# Patient Record
Sex: Male | Born: 1995 | Race: White | Hispanic: Yes | Marital: Single | Smoking: Never smoker
Health system: Southern US, Community
[De-identification: ages and names within clinical notes are randomized; demographics above are authoritative.]

## PROBLEM LIST (undated history)

## (undated) DIAGNOSIS — J45909 Unspecified asthma, uncomplicated: Secondary | ICD-10-CM

## (undated) HISTORY — PX: TONSILLECTOMY: SUR1361

---
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
## 2014-05-16 ENCOUNTER — Encounter (HOSPITAL_COMMUNITY): Payer: Self-pay | Admitting: Emergency Medicine

## 2014-05-16 ENCOUNTER — Emergency Department (HOSPITAL_COMMUNITY)
Admission: EM | Admit: 2014-05-16 | Discharge: 2014-05-16 | Disposition: A | Discharge status: Home / Self Care | Payer: Self-pay | Attending: Emergency Medicine | Admitting: Emergency Medicine

## 2014-05-16 DIAGNOSIS — R55 Syncope and collapse: Secondary | ICD-10-CM | POA: Insufficient documentation

## 2014-05-16 DIAGNOSIS — E162 Hypoglycemia, unspecified: Secondary | ICD-10-CM | POA: Insufficient documentation

## 2014-05-16 DIAGNOSIS — Z79899 Other long term (current) drug therapy: Secondary | ICD-10-CM | POA: Insufficient documentation

## 2014-05-16 DIAGNOSIS — J45909 Unspecified asthma, uncomplicated: Secondary | ICD-10-CM | POA: Insufficient documentation

## 2014-05-16 HISTORY — DX: Unspecified asthma, uncomplicated: J45.909

## 2014-05-16 LAB — BASIC METABOLIC PANEL
ANION GAP: 4 — AB (ref 5–15)
BUN: 11 mg/dL (ref 6–23)
CO2: 27 mmol/L (ref 19–32)
Calcium: 9.2 mg/dL (ref 8.4–10.5)
Chloride: 108 mmol/L (ref 96–112)
Creatinine, Ser: 0.91 mg/dL (ref 0.50–1.35)
GFR calc Af Amer: >90 mL/min (ref >90)
Glucose, Bld: 92 mg/dL (ref 70–99)
Potassium: 4.3 mmol/L (ref 3.5–5.1)
SODIUM: 139 mmol/L (ref 135–145)

## 2014-05-16 LAB — CBC
HEMATOCRIT: 43.6 % (ref 39.0–52.0)
Hemoglobin: 14.8 g/dL (ref 13.0–17.0)
MCH: 30.3 pg (ref 26.0–34.0)
MCHC: 33.9 g/dL (ref 30.0–36.0)
MCV: 89.3 fL (ref 78.0–100.0)
Platelets: 264 10*3/uL (ref 150–400)
RBC: 4.88 MIL/uL (ref 4.22–5.81)
RDW: 12.9 % (ref 11.5–15.5)
WBC: 7.5 10*3/uL (ref 4.0–10.5)

## 2014-05-16 NOTE — ED Notes (Signed)
After ambulating around ED pt denies dizziness/lightheadedness. BP 96/51 HR 75 sats 97% RA. MD aware.

## 2014-05-16 NOTE — ED Notes (Signed)
Awake. Verbally responsive. A/O x4. Resp even and unlabored. No audible adventitious breath sounds noted. ABC's intact.  

## 2014-05-16 NOTE — ED Notes (Signed)
Bed: [id number] Expected date:  Expected time:  Means of arrival:  Comments: Felt dizzy low blood sugar

## 2014-05-16 NOTE — ED Provider Notes (Signed)
CSN: [id number]     Arrival date & time 05/16/14  1226 History   First MD Initiated Contact with Patient 05/16/14 1232     Chief Complaint  Patient presents with  . Hypoglycemia     (Consider location/radiation/quality/duration/timing/severity/associated sxs/prior Treatment) HPI Comments: The patient is a 19 year old male, history of asthma presents after having a near syncopal event at the gym when he was lifting weights, states that he has never had this before, he states that he was very sweaty after this happened. He had only eaten a small piece of fruit prior to going to the gym this morning, on arrival the paramedics found the patient to be hypoglycemic, gave him oral glucose within normal change in blood sugar to 94 during which time his symptoms resolved as well. He denies any other prodromal symptoms, he has been feeling well lately, he takes no daily medications other than as needed albuterol, denies chest pain shortness breath cough fever or headache chills nausea vomiting blurred vision numbness weakness rashes or swelling.  Patient is a 19 y.o. male presenting with hypoglycemia. The history is provided by the patient.  Hypoglycemia   Past Medical History  Diagnosis Date  . Asthma    Past Surgical History  Procedure Laterality Date  . Tonsillectomy     History reviewed. No pertinent family history. History  Substance Use Topics  . Smoking status: Never Smoker   . Smokeless tobacco: Not on file  . Alcohol Use: No    Review of Systems  All other systems reviewed and are negative.     Allergies  Review of patient's allergies indicates no known allergies.  Home Medications   Prior to Admission medications   Medication Sig Start Date End Date Taking? Authorizing Provider  albuterol (PROVENTIL HFA;VENTOLIN HFA) 108 (90 BASE) MCG/ACT inhaler Inhale 2 puffs into the lungs every 6 (six) hours as needed for wheezing or shortness of breath.   Yes Historical Provider, MD    BP 103/52 mmHg  Pulse 70  Temp(Src) 97.7 F (36.5 C) (Oral)  Resp 14  Ht  (1.702 m)  Wt 120 lb (54.432 kg)  BMI 18.79 kg/m2  SpO2 100% Physical Exam  Constitutional: He appears well-developed and well-nourished. No distress.  HENT:  Head: Normocephalic and atraumatic.  Mouth/Throat: Oropharynx is clear and moist. No oropharyngeal exudate.  Eyes: Conjunctivae and EOM are normal. Pupils are equal, round, and reactive to light. Right eye exhibits no discharge. Left eye exhibits no discharge. No scleral icterus.  Neck: Normal range of motion. Neck supple. No JVD present. No thyromegaly present.  Cardiovascular: Normal rate, regular rhythm, normal heart sounds and intact distal pulses.  Exam reveals no gallop and no friction rub.   No murmur heard. Pulmonary/Chest: Effort normal and breath sounds normal. No respiratory distress. He has no wheezes. He has no rales.  Abdominal: Soft. Bowel sounds are normal. He exhibits no distension and no mass. There is no tenderness.  Musculoskeletal: Normal range of motion. He exhibits no edema or tenderness.  Lymphadenopathy:    He has no cervical adenopathy.  Neurological: He is alert. Coordination normal.  Skin: Skin is warm and dry. No rash noted. No erythema.  Psychiatric: He has a normal mood and affect. His behavior is normal.  Nursing note and vitals reviewed.   ED Course  Procedures (including critical care time) Labs Review Labs Reviewed  BASIC METABOLIC PANEL - Abnormal; Notable for the following:    Anion gap 4 (*)  All other components within normal limits  CBC    Imaging Review No results found.   EKG Interpretation   Date/Time:  Saturday May 16 2014 13:19:25 EDT Ventricular Rate:  72 PR Interval:  124 QRS Duration: 91 QT Interval:  364 QTC Calculation: 398 R Axis:   83 Text Interpretation:  Sinus rhythm Normal ECG No old tracing to compare  Confirmed by Cagney Degrace  MD, Peighton Mehra ([id number]) on 05/16/2014 1:25:49 PM       MDM   Final diagnoses:  Hypoglycemia  Near syncope    Normal cranial nerves III through XII, normal strength and sensation in all fours jammies, normal speech and coordination, normal gait. Vital signs unremarkable, check EKG, hemoglobin, i-STAT chemistry, oral intake, anticipate discharge.  ECG without acute findings - labs normal - pt tolerated PO -s table for d/c.  Eber HongBrian Xai Frerking, MD 05/16/14 [phone]

## 2014-05-16 NOTE — ED Notes (Signed)
Awake. Verbally responsive. A/O x4. Resp even and unlabored. No audible adventitious breath sounds noted. ABC's intact. SR on monitor. IV saline lock patent and intact. 

## 2014-05-16 NOTE — ED Notes (Signed)
Awake. Verbally responsive. A/O x4. Resp even and unlabored. No audible adventitious breath sounds noted. ABC's intact. SR on monitor at 74bpm.

## 2014-05-16 NOTE — ED Notes (Addendum)
Awake. Verbally responsive. A/O x4. Resp even and unlabored. No audible adventitious breath sounds noted. ABC's intact. SR on monitor at 80. IV saline lock patent and intact. Family at bedside. BP 87/47 and asymptomatic. MD aware and given order to ambulated pt and recheck BP.

## 2014-05-16 NOTE — ED Notes (Signed)
Awake. Verbally responsive. A/O x4. Resp even and unlabored. No audible adventitious breath sounds noted. ABC's intact. SR on monitor. IV saline lock patent and intact. Family at bedside. 

## 2014-05-16 NOTE — Discharge Instructions (Signed)
Please call your doctor for a followup appointment within 24-48 hours. When you talk to your doctor please let them know that you were seen in the emergency department and have them acquire all of your records so that they can discuss the findings with you and formulate a treatment plan to fully care for your new and ongoing problems. ° °RESOURCE GUIDE ° °Chronic Pain Problems: °Contact [facility]  [phone] °Patients need to be referred by their primary care doctor. ° °Insufficient Money for Medicine: °Contact [employer]:  call "211."  ° °No Primary Care Doctor: °- Call [facility]  [phone] - can help you locate a primary care doctor that  accepts your insurance, provides certain services, etc. °- Physician Referral Service- [phone] ° °Agencies that provide inexpensive medical care: °- [facility]  [phone] °- [facility]  [phone] °- [facility]  [phone] °- [facility]  [phone] °- [facility]  [phone] °- [facility]  [phone] ° °Medicaid-accepting [place] Providers: °- [facility]- [address] ° [phone], Mon-Fri 9am-7pm, Sat 9am-1pm °- [facility]- [address], [address] ° [phone] °- [facility]- [address], [address] ° [phone] °- [facility]- [address] [place] [address] ° [phone] °- [facility]- [address], [phone] ° Only accepts [place] Access Medicaid patients after they have their name  applied to their card ° °Self Pay (no insurance) in [place]: °- Sickle Cell Patients: Dr Michall Dean, [facility] ° [address], [phone] °- [facility]- [address] ° [phone] °      -     [facility] - [address] °      -     [facility]- see information above (Speak to Pam H if you do not have  insurance) °      -  [facility] [place]- [address],  [phone] °      -  [facility]- [address] [place] [address], [phone] °      -  Dr Osei-Bonsu-  [address], [place], [phone] °      -  [facility] - [address], [phone] °      -  [facility]- [address] [place] [address], [phone], also [address]  [address], [phone] °      -    [facility]- [address], [phone], 1st & 3rd Saturday °       every month, 10am-1pm ° °[facility] °[address] °, [place] [address] °[phone] ° °[facility] °[address] °[place], [place] [address] °([phone] ° °1) Find a Doctor and Pay Out of Pocket °Although you won't have to find out who is covered by your insurance plan, it is a good idea to ask around and get recommendations. You will then need to call the office and see if the doctor you have chosen will accept you as a new patient and what types of options they offer for patients who are self-pay. Some doctors offer discounts or will set up payment plans for their patients who do not have insurance, but you will need to ask so you aren't surprised when you   get to your appointment. ° °2) Contact Your Local Health Department °Not all health departments have doctors that can see patients for sick visits, but many do, so it is worth a call to see if yours does. If you don't know where your local health department is, you can check in your phone book. The CDC also has a tool to help you locate your state's health department, and many state websites also have listings of all of their local health departments. ° °3) Find a Walk-in Clinic °If your illness is not likely to be very severe or complicated, you may want to try a walk in clinic. These are popping up all over the country in pharmacies, drugstores, and shopping centers. They're usually staffed by nurse practitioners or physician  assistants that have been trained to treat common illnesses and complaints. They're usually fairly quick and inexpensive. However, if you have serious medical issues or chronic medical problems, these are probably not your best option ° °STD Testing °- [place] [facility] [place], [facility], [address], [place], phone [phone] or [phone].  Monday - Friday, call for an appointment. °- [place] [facility] [place], [facility], [address], [place], phone [phone] or [phone].  Monday - Friday, call for an appointment. ° °Abuse/Neglect: °- [place] Child Abuse Hotline (336) 641-3795 °- [place] Child Abuse Hotline 800-378-5315 (After Hours) ° °Emergency Shelter:  [facility] (336) 271-5985 ° °Maternity Homes: °- [facility] (336) 275-9566 °- [facility] (704) 372-4663 ° °MRSA Hotline #:   832-7006 ° °Dental Assistance °If unable to pay or uninsured, contact:  [place] [facility] to become qualified for the adult dental clinic. ° °Patients with Medicaid: [facility] °[address], [phone] °[address], [phone] ° °If unable to pay, or uninsured, contact [place] [facility] ([phone] in [place], [phone] in [place]) to become qualified for the adult dental clinic ° °[facility] °[address] °, [place] [address] °(336) 272-4177 °[email or web address] ° °Other Low-Cost Community Dental Services: °- [facility]- [address], [place], [place], [address], [phone], Ext. 123, 2nd and 4th Thursday of the month at 6:30am.  10 clients each day by appointment, can sometimes see walk-in patients if someone does not show for an appointment. °- [facility]- [address], [place], [place], [address], [phone] °- [facility]- [address], [place], [place],  [address], [phone] °- [facility]- [phone] °- [facility]- [phone] °- [facility]- [phone] °-  °

## 2014-05-16 NOTE — ED Notes (Signed)
Pt arrived via EMS with c/o dizziness, near syncope episode and diaphoresis after working out [employer]. Pt had eaten only a banana prior to working out and first day working out in a year. EMS checked CBG at 66 and given glucose tube x1 with increase to 94 and pt reported symptoms resolved.

## 2019-01-03 ENCOUNTER — Other Ambulatory Visit: Payer: Self-pay

## 2019-01-03 ENCOUNTER — Emergency Department (HOSPITAL_COMMUNITY)
Admission: EM | Admit: 2019-01-03 | Discharge: 2019-01-03 | Disposition: A | Discharge status: Home / Self Care | Payer: Self-pay | Attending: Emergency Medicine | Admitting: Emergency Medicine

## 2019-01-03 ENCOUNTER — Encounter (HOSPITAL_COMMUNITY): Payer: Self-pay | Admitting: Emergency Medicine

## 2019-01-03 DIAGNOSIS — Y9389 Activity, other specified: Secondary | ICD-10-CM | POA: Insufficient documentation

## 2019-01-03 DIAGNOSIS — S51812A Laceration without foreign body of left forearm, initial encounter: Secondary | ICD-10-CM | POA: Insufficient documentation

## 2019-01-03 DIAGNOSIS — Y999 Unspecified external cause status: Secondary | ICD-10-CM | POA: Insufficient documentation

## 2019-01-03 DIAGNOSIS — W25XXXA Contact with sharp glass, initial encounter: Secondary | ICD-10-CM | POA: Insufficient documentation

## 2019-01-03 DIAGNOSIS — S41112A Laceration without foreign body of left upper arm, initial encounter: Secondary | ICD-10-CM

## 2019-01-03 DIAGNOSIS — Z23 Encounter for immunization: Secondary | ICD-10-CM | POA: Insufficient documentation

## 2019-01-03 DIAGNOSIS — Y929 Unspecified place or not applicable: Secondary | ICD-10-CM | POA: Insufficient documentation

## 2019-01-03 DIAGNOSIS — J45909 Unspecified asthma, uncomplicated: Secondary | ICD-10-CM | POA: Insufficient documentation

## 2019-01-03 HISTORY — DX: Unspecified asthma, uncomplicated: J45.909

## 2019-01-03 MED ORDER — LIDOCAINE HCL (PF) 1 % IJ SOLN
30.0000 mL | Freq: Once | INTRAMUSCULAR | Status: AC
  Administered 2019-01-03: 30 mL
  Filled 2019-01-03: qty 30

## 2019-01-03 MED ORDER — TETANUS-DIPHTH-ACELL PERTUSSIS 5-2.5-18.5 LF-MCG/0.5 IM SUSP
0.5000 mL | Freq: Once | INTRAMUSCULAR | Status: AC
  Administered 2019-01-03: 0.5 mL via INTRAMUSCULAR
  Filled 2019-01-03: qty 0.5

## 2019-01-03 MED ORDER — ACETAMINOPHEN 325 MG PO TABS
650.0000 mg | ORAL_TABLET | Freq: Once | ORAL | Status: AC
  Administered 2019-01-03: 650 mg via ORAL
  Filled 2019-01-03: qty 2

## 2019-01-03 NOTE — Discharge Instructions (Addendum)
1. Medications: Tylenol or ibuprofen for pain, usual home medications ° °2. Treatment: ice for swelling, keep wound clean with warm soap and water and keep bandage dry, do not submerge in water for 24 hours ° °3. Follow Up: Please return in 10 days to have your stitches/staples removed or sooner if you have concerns. Return to the emergency department for increased redness, drainage of pus from the wound ° ° °WOUND CARE ° Keep area clean and dry for 24 hours. Do not remove bandage, if applied. ° After 24 hours, remove bandage and wash wound gently with mild soap and warm water. Reapply a new bandage after cleaning wound, if directed.  ° Continue daily cleansing with soap and water until stitches/staples are removed. ° Do not apply any ointments or creams to the wound while stitches/staples are in place, as this may cause delayed healing. °Return if you experience any of the following signs of infection: Swelling, redness, pus drainage, streaking, fever >101.0 F ° Return if you experience excessive bleeding that does not stop after 15-20 minutes of constant, firm pressure. ° °

## 2019-01-03 NOTE — ED Triage Notes (Signed)
Pt reports cutting his Left forearm on a glass mug while at work today, last tetanus unknown. Bleeding controlled with bandage.

## 2019-01-03 NOTE — ED Provider Notes (Addendum)
[facility] EMERGENCY DEPARTMENT Provider Note   CSN: [id number] Arrival date & time: 01/03/19  1432     History Chief Complaint  Patient presents with  . Laceration    Aaron Franklin is a 23 y.o. male the past medical history of asthma presents emergency room today with chief complaint of laceration to left forearm.  Onset was acute happening 2 hours prior to arrival.  Patient states he was at work when he was carrying a large glass mug that broke.  He states the mug broke into two large pieces. The glass did not shatter and there were no small pieces.  He immediately applied pressure to the laceration and bleeding was able to be controlled. He is reporting pain localized to the laceration.  He describes pain as sharp.  He rates pain 10 of 10 in severity.  He did not take any medications for pain prior to arrival.  He is unsure of last tetanus immunization.  He did not fall, hit his head or lose consciousness.  Denies any numbness, weakness, tingling sensation.  History provided by patient with additional history obtained from chart review.      Past Medical History:  Diagnosis Date  . Asthma     There are no problems to display for this patient.   Past Surgical History:  Procedure Laterality Date  . TONSILLECTOMY         No family history on file.  Social History   Tobacco Use  . Smoking status: Not on file  Substance Use Topics  . Alcohol use: Not on file  . Drug use: Not on file    Home Medications Prior to Admission medications   Not on File    Allergies    Patient has no known allergies.  Review of Systems   Review of Systems All other systems are reviewed and are negative for acute change except as noted in the HPI.  Physical Exam Updated Vital Signs BP 100/66   Pulse 83   Temp 97.9 F (36.6 C) (Oral)   Resp 12   SpO2 98%   Physical Exam Vitals and nursing note reviewed.  Constitutional:      Appearance: He is  well-developed. He is not ill-appearing or toxic-appearing.  HENT:     Head: Normocephalic and atraumatic.     Nose: Nose normal.  Eyes:     General: No scleral icterus.       Right eye: No discharge.        Left eye: No discharge.     Conjunctiva/sclera: Conjunctivae normal.  Neck:     Vascular: No JVD.  Cardiovascular:     Rate and Rhythm: Normal rate and regular rhythm.     Pulses: Normal pulses.          Radial pulses are 2+ on the right side and 2+ on the left side.     Heart sounds: Normal heart sounds.  Pulmonary:     Effort: Pulmonary effort is normal.     Breath sounds: Normal breath sounds.  Abdominal:     General: There is no distension.  Musculoskeletal:        General: Normal range of motion.     Left elbow: Normal.     Left hand: Normal.     Cervical back: Normal range of motion.  Skin:    General: Skin is warm and dry.     Findings: Bruising: .Charlotta Newton.     Comments: 3 cm laceration  to volar aspect of left forearm. Bleeding is controlled. Strength of left wrist intact against resistance.  Brisk cap refill. Radial pulse is 2+ bilaterally. Able to wiggle all fingers   Neurological:     Mental Status: He is oriented to person, place, and time.     GCS: GCS eye subscore is 4. GCS verbal subscore is 5. GCS motor subscore is 6.     Comments: Fluent speech, no facial droop.  Psychiatric:        Behavior: Behavior normal.       ED Results / Procedures / Treatments   Labs (all labs ordered are listed, but only abnormal results are displayed) Labs Reviewed - No data to display  EKG None  Radiology No results found.  Procedures .Marland KitchenLaceration Repair  Date/Time: 01/03/2019 4:32 PM Performed by: Cherre Robins, PA-C Authorized by: Cherre Robins, PA-C   Consent:    Consent obtained:  Verbal   Consent given by:  Patient   Risks discussed:  Infection, pain and retained foreign body   Alternatives discussed:  No treatment Universal protocol:     Immediately prior to procedure, a time out was called: yes     Patient identity confirmed:  Verbally with patient and arm band Anesthesia (see MAR for exact dosages):    Anesthesia method:  Local infiltration   Local anesthetic:  Lidocaine 1% w/o epi Laceration details:    Location:  Shoulder/arm   Shoulder/arm location:  L lower arm   Length (cm):  3   Depth (mm):  2 Repair type:    Repair type:  Simple Pre-procedure details:    Preparation:  Patient was prepped and draped in usual sterile fashion Exploration:    Hemostasis achieved with:  Direct pressure   Wound exploration: wound explored through full range of motion and entire depth of wound probed and visualized     Wound extent: no foreign bodies/material noted, no muscle damage noted, no nerve damage noted, no tendon damage noted and no vascular damage noted     Contaminated: no   Treatment:    Area cleansed with:  Saline   Amount of cleaning:  Standard   Irrigation solution:  Sterile water   Irrigation volume:  500 ml   Irrigation method:  Syringe   Visualized foreign bodies/material removed: no   Skin repair:    Repair method:  Sutures   Suture size:  4-0   Suture material:  Prolene   Suture technique:  Simple interrupted   Number of sutures:  4 Approximation:    Approximation:  Close Post-procedure details:    Dressing:  Bulky dressing   Patient tolerance of procedure:  Tolerated well, no immediate complications   (including critical care time)  Medications Ordered in ED Medications  lidocaine (PF) (XYLOCAINE) 1 % injection 30 mL (30 mLs Infiltration Given 01/03/19 1537)  Tdap (BOOSTRIX) injection 0.5 mL (0.5 mLs Intramuscular Given 01/03/19 1535)  acetaminophen (TYLENOL) tablet 650 mg (650 mg Oral Given 01/03/19 1606)    ED Course  I have reviewed the triage vital signs and the nursing notes.  Pertinent labs & imaging results that were available during my care of the patient were reviewed by me and  considered in my medical decision making (see chart for details).    MDM Rules/Calculators/A&P                      Patient presents to the emergency department with laceration to left forearm  which occurred within 2 hours PTA . Patient nontoxic appearing, resting comfortably. Pressure irrigation performed.  Imaging not indicated at this time.  No signs of tendon or muscle injury on exam.  Radial pulse 2+ bilaterally.  Wound explored and base of wound visualized in a bloodless field without evidence of foreign body. Laceration repair per procedure note above, tolerated well. Tetanus updated at today's visit. Do not feel that abx are indicated at this time based on wound appearance and lack of significant comorbidities. Discussed suture home care as well as need for wound recheck and suture removal in 10 days.  I discussed results, treatment plan, need for follow-up, and return precautions with the patient including signs of infection. Provided opportunity for questions, patient confirmed understanding and is in agreement with plan.     Portions of this note were generated with Scientist, clinical (histocompatibility and immunogenetics)Dragon dictation software. Dictation errors may occur despite best attempts at proofreading.   Final Clinical Impression(s) / ED Diagnoses Final diagnoses:  Laceration of left upper extremity, initial encounter    Rx / DC Orders ED Discharge Orders    None       Sherene Sireslbrizze, Finley Chevez E, PA-C 01/03/19 1627    Sherene Sireslbrizze, Sharlyne Koeneman E, PA-C 01/03/19 1633    Charlynne PanderYao, David Hsienta, MD 01/03/19 2047

## 2019-01-03 NOTE — ED Notes (Signed)
Pt discharge instructions reviewed with the patient. The patient verbalized understanding of instructions. Pt discharged. 

## 2022-08-25 ENCOUNTER — Emergency Department (HOSPITAL_COMMUNITY): Payer: Self-pay

## 2022-08-25 ENCOUNTER — Encounter (HOSPITAL_COMMUNITY): Payer: Self-pay | Admitting: *Deleted

## 2022-08-25 ENCOUNTER — Inpatient Hospital Stay (HOSPITAL_COMMUNITY)
Admission: EM | Admit: 2022-08-25 | Discharge: 2022-08-28 | DRG: 141 | Disposition: A | Discharge status: Home / Self Care | Payer: Self-pay | Attending: Surgery | Admitting: Surgery

## 2022-08-25 ENCOUNTER — Other Ambulatory Visit: Payer: Self-pay

## 2022-08-25 DIAGNOSIS — S32040A Wedge compression fracture of fourth lumbar vertebra, initial encounter for closed fracture: Secondary | ICD-10-CM | POA: Diagnosis present

## 2022-08-25 DIAGNOSIS — S0285XA Fracture of orbit, unspecified, initial encounter for closed fracture: Secondary | ICD-10-CM | POA: Diagnosis present

## 2022-08-25 DIAGNOSIS — Y92410 Unspecified street and highway as the place of occurrence of the external cause: Secondary | ICD-10-CM

## 2022-08-25 DIAGNOSIS — S0219XA Other fracture of base of skull, initial encounter for closed fracture: Secondary | ICD-10-CM | POA: Diagnosis present

## 2022-08-25 DIAGNOSIS — S02412A LeFort II fracture, initial encounter for closed fracture: Secondary | ICD-10-CM | POA: Diagnosis present

## 2022-08-25 DIAGNOSIS — M26220 Open anterior occlusal relationship: Secondary | ICD-10-CM | POA: Diagnosis present

## 2022-08-25 DIAGNOSIS — J45909 Unspecified asthma, uncomplicated: Secondary | ICD-10-CM | POA: Diagnosis present

## 2022-08-25 DIAGNOSIS — M264 Malocclusion, unspecified: Secondary | ICD-10-CM | POA: Diagnosis present

## 2022-08-25 DIAGNOSIS — S022XXA Fracture of nasal bones, initial encounter for closed fracture: Secondary | ICD-10-CM | POA: Diagnosis present

## 2022-08-25 DIAGNOSIS — S01511A Laceration without foreign body of lip, initial encounter: Secondary | ICD-10-CM | POA: Diagnosis present

## 2022-08-25 LAB — COMPREHENSIVE METABOLIC PANEL
ALT: 43 U/L (ref 0–44)
AST: 40 U/L (ref 15–41)
Albumin: 4 g/dL (ref 3.5–5.0)
Alkaline Phosphatase: 57 U/L (ref 38–126)
Anion gap: 9 (ref 5–15)
BUN: 13 mg/dL (ref 6–20)
CO2: 23 mmol/L (ref 22–32)
Calcium: 8.8 mg/dL — ABNORMAL LOW (ref 8.9–10.3)
Chloride: 104 mmol/L (ref 98–111)
Creatinine, Ser: 0.9 mg/dL (ref 0.61–1.24)
GFR, Estimated: >60 mL/min (ref >60)
Glucose, Bld: 119 mg/dL — ABNORMAL HIGH (ref 70–99)
Potassium: 3.5 mmol/L (ref 3.5–5.1)
Sodium: 136 mmol/L (ref 135–145)
Total Bilirubin: 0.9 mg/dL (ref 0.3–1.2)
Total Protein: 5.2 g/dL — ABNORMAL LOW (ref 6.5–8.1)

## 2022-08-25 LAB — SAMPLE TO BLOOD BANK

## 2022-08-25 LAB — I-STAT CHEM 8, ED
BUN: 16 mg/dL (ref 6–20)
Calcium, Ion: 1.11 mmol/L — ABNORMAL LOW (ref 1.15–1.40)
Chloride: 104 mmol/L (ref 98–111)
Creatinine, Ser: 0.9 mg/dL (ref 0.61–1.24)
Glucose, Bld: 114 mg/dL — ABNORMAL HIGH (ref 70–99)
HCT: 49 % (ref 39.0–52.0)
Hemoglobin: 16.7 g/dL (ref 13.0–17.0)
Potassium: 3.6 mmol/L (ref 3.5–5.1)
Sodium: 139 mmol/L (ref 135–145)
TCO2: 23 mmol/L (ref 22–32)

## 2022-08-25 LAB — CBC
HCT: 48.6 % (ref 39.0–52.0)
Hemoglobin: 16.4 g/dL (ref 13.0–17.0)
MCH: 30 pg (ref 26.0–34.0)
MCHC: 33.7 g/dL (ref 30.0–36.0)
MCV: 89 fL (ref 80.0–100.0)
Platelets: 291 10*3/uL (ref 150–400)
RBC: 5.46 MIL/uL (ref 4.22–5.81)
RDW: 13.2 % (ref 11.5–15.5)
WBC: 10.9 10*3/uL — ABNORMAL HIGH (ref 4.0–10.5)
nRBC: 0 % (ref 0.0–0.2)

## 2022-08-25 LAB — URINALYSIS, ROUTINE W REFLEX MICROSCOPIC
Bilirubin Urine: NEGATIVE
Glucose, UA: NEGATIVE mg/dL
Ketones, ur: 80 mg/dL — AB
Leukocytes,Ua: NEGATIVE
Nitrite: NEGATIVE
Protein, ur: NEGATIVE mg/dL
Specific Gravity, Urine: >1.046 — ABNORMAL HIGH (ref 1.005–1.030)
pH: 7 (ref 5.0–8.0)

## 2022-08-25 LAB — HIV ANTIBODY (ROUTINE TESTING W REFLEX): HIV Screen 4th Generation wRfx: NONREACTIVE

## 2022-08-25 LAB — I-STAT CG4 LACTIC ACID, ED: Lactic Acid, Venous: 1.9 mmol/L (ref 0.5–1.9)

## 2022-08-25 LAB — PROTIME-INR
INR: 1.1 (ref 0.8–1.2)
Prothrombin Time: 14.8 s (ref 11.4–15.2)

## 2022-08-25 LAB — ETHANOL: Alcohol, Ethyl (B): <10 mg/dL (ref <10)

## 2022-08-25 MED ORDER — ONDANSETRON HCL 4 MG/2ML IJ SOLN
4.0000 mg | Freq: Once | INTRAMUSCULAR | Status: AC
  Administered 2022-08-25: 4 mg via INTRAVENOUS
  Filled 2022-08-25: qty 2

## 2022-08-25 MED ORDER — MELATONIN 3 MG PO TABS: 3.0000 mg | ORAL_TABLET | Freq: Every evening | ORAL | Status: DC | PRN

## 2022-08-25 MED ORDER — HYDRALAZINE HCL 20 MG/ML IJ SOLN: 10.0000 mg | INTRAMUSCULAR | Status: DC | PRN

## 2022-08-25 MED ORDER — IOHEXOL 350 MG/ML SOLN
75.0000 mL | Freq: Once | INTRAVENOUS | Status: AC | PRN
  Administered 2022-08-25: 75 mL via INTRAVENOUS

## 2022-08-25 MED ORDER — LIDOCAINE-EPINEPHRINE (PF) 2 %-1:200000 IJ SOLN
10.0000 mL | Freq: Once | INTRAMUSCULAR | Status: AC
  Administered 2022-08-25: 10 mL
  Filled 2022-08-25: qty 20

## 2022-08-25 MED ORDER — METOPROLOL TARTRATE 5 MG/5ML IV SOLN: 5.0000 mg | Freq: Four times a day (QID) | INTRAVENOUS | Status: DC | PRN

## 2022-08-25 MED ORDER — MORPHINE SULFATE (PF) 4 MG/ML IV SOLN
4.0000 mg | Freq: Once | INTRAVENOUS | Status: AC
  Administered 2022-08-25: 4 mg via INTRAVENOUS
  Filled 2022-08-25: qty 1

## 2022-08-25 MED ORDER — OXYCODONE-ACETAMINOPHEN 5-325 MG PO TABS
2.0000 | ORAL_TABLET | Freq: Once | ORAL | Status: AC
  Administered 2022-08-25: 2 via ORAL
  Filled 2022-08-25: qty 2

## 2022-08-25 MED ORDER — OXYCODONE HCL 5 MG PO TABS
5.0000 mg | ORAL_TABLET | ORAL | Status: DC | PRN
  Administered 2022-08-25: 5 mg via ORAL
  Filled 2022-08-25 (×2): qty 1

## 2022-08-25 MED ORDER — OXYMETAZOLINE HCL 0.05 % NA SOLN
3.0000 | Freq: Once | NASAL | Status: AC
  Administered 2022-08-25: 3 via NASAL
  Filled 2022-08-25: qty 30

## 2022-08-25 MED ORDER — ACETAMINOPHEN 500 MG PO TABS
1000.0000 mg | ORAL_TABLET | Freq: Four times a day (QID) | ORAL | Status: DC
  Administered 2022-08-25 – 2022-08-28 (×10): 1000 mg via ORAL
  Filled 2022-08-25 (×11): qty 2

## 2022-08-25 MED ORDER — ENOXAPARIN SODIUM 30 MG/0.3ML IJ SOSY
30.0000 mg | PREFILLED_SYRINGE | Freq: Two times a day (BID) | INTRAMUSCULAR | Status: DC
  Administered 2022-08-26 – 2022-08-28 (×4): 30 mg via SUBCUTANEOUS
  Filled 2022-08-25 (×4): qty 0.3

## 2022-08-25 MED ORDER — SODIUM CHLORIDE 0.9 % IV BOLUS
1000.0000 mL | Freq: Once | INTRAVENOUS | Status: AC
  Administered 2022-08-25: 1000 mL via INTRAVENOUS

## 2022-08-25 MED ORDER — DOCUSATE SODIUM 100 MG PO CAPS
100.0000 mg | ORAL_CAPSULE | Freq: Two times a day (BID) | ORAL | Status: DC
  Administered 2022-08-25 – 2022-08-28 (×5): 100 mg via ORAL
  Filled 2022-08-25 (×5): qty 1

## 2022-08-25 MED ORDER — METHOCARBAMOL 500 MG PO TABS
500.0000 mg | ORAL_TABLET | Freq: Three times a day (TID) | ORAL | Status: DC
  Administered 2022-08-25 – 2022-08-28 (×8): 500 mg via ORAL
  Filled 2022-08-25 (×8): qty 1

## 2022-08-25 MED ORDER — SODIUM CHLORIDE 0.9 % IV SOLN: INTRAVENOUS | Status: DC

## 2022-08-25 MED ORDER — METHOCARBAMOL 1000 MG/10ML IJ SOLN
500.0000 mg | Freq: Three times a day (TID) | INTRAVENOUS | Status: DC
  Filled 2022-08-25: qty 5

## 2022-08-25 MED ORDER — POLYETHYLENE GLYCOL 3350 17 G PO PACK: 17.0000 g | PACK | Freq: Every day | ORAL | Status: DC | PRN

## 2022-08-25 MED ORDER — MORPHINE SULFATE (PF) 2 MG/ML IV SOLN
2.0000 mg | INTRAVENOUS | Status: DC | PRN
  Administered 2022-08-27: 2 mg via INTRAVENOUS
  Filled 2022-08-25: qty 1

## 2022-08-25 MED ORDER — ONDANSETRON HCL 4 MG/2ML IJ SOLN
4.0000 mg | Freq: Four times a day (QID) | INTRAMUSCULAR | Status: DC | PRN
  Administered 2022-08-27: 4 mg via INTRAVENOUS
  Filled 2022-08-25: qty 2

## 2022-08-25 MED ORDER — ONDANSETRON 4 MG PO TBDP: 4.0000 mg | ORAL_TABLET | Freq: Four times a day (QID) | ORAL | Status: DC | PRN

## 2022-08-25 NOTE — Anesthesia Preprocedure Evaluation (Signed)
Anesthesia Evaluation  Patient identified by MRN, date of birth, ID band Patient awake    Reviewed: Allergy & Precautions, NPO status , Patient's Chart, lab work & pertinent test results  History of Anesthesia Complications Negative for: history of anesthetic complications  Airway Mallampati: III  TM Distance: >3 FB Neck ROM: Full  Mouth opening: Limited Mouth Opening  Dental  (+) Dental Advisory Given   Pulmonary asthma    Pulmonary exam normal        Cardiovascular negative cardio ROS Normal cardiovascular exam     Neuro/Psych negative neurological ROS  negative psych ROS   GI/Hepatic negative GI ROS, Neg liver ROS,,,  Endo/Other  negative endocrine ROS    Renal/GU negative Renal ROS     Musculoskeletal negative musculoskeletal ROS (+)    Abdominal   Peds  Hematology negative hematology ROS (+)   Anesthesia Other Findings   Reproductive/Obstetrics                             Anesthesia Physical Anesthesia Plan  ASA: 2  Anesthesia Plan: General   Post-op Pain Management: Toradol IV (intra-op)* and Ofirmev IV (intra-op)*   Induction: Intravenous  PONV Risk Score and Plan: 2 and Treatment may vary due to age or medical condition, Ondansetron, Dexamethasone and Midazolam  Airway Management Planned: Video Laryngoscope Planned and Nasal ETT  Additional Equipment: None  Intra-op Plan:   Post-operative Plan: Extubation in OR  Informed Consent: I have reviewed the patients History and Physical, chart, labs and discussed the procedure including the risks, benefits and alternatives for the proposed anesthesia with the patient or authorized representative who has indicated his/her understanding and acceptance.     Dental advisory given  Plan Discussed with: CRNA, Anesthesiologist and Surgeon  Anesthesia Plan Comments: (Surgeon states he requires nasal intubation for this  procedure, feels that securing airway via right nare is feasible. Surgeon to be at bedside during airway manipulation )       Anesthesia Quick Evaluation
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
## 2022-08-25 NOTE — ED Notes (Signed)
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 bone findings. Other: None. CT MAXILLOFACIAL FINDINGS Osseous: There are complex bilateral facial fractures. -there are mildly displaced fractures of the medial and lateral pterygoid plates on the left in the lateral pterygoid plate on the right. -there is a comminuted fracture of the orbital floor on the left (series 9, image 61). There is a nondisplaced fracture of the lateral orbital wall on the left (series 3, image 68). There is also a nondisplaced fracture through the frontal process of the left maxillary bone (series 9, image 70). -there is a mildly displaced fracture of the anterior, lateral, and posterior wall of the left maxillary sinus. Fracture along the anterior maxillary wall extends through the left nasolacrimal duct (series 3, image 58). Fracture of the posterior left maxillary sinus extends into the left sphenopalatine foramen (series 3, image 59). -there is a mildly displaced fracture of the extends through the posterior aspect of the alveolar ridge on the left (series 9, image 56). -mildly displaced nasal bone fractures bilaterally. There is also a mildly displaced fracture of the  bony nasal septum (series 9, image 65). -there is a subtle cortical irregularity along the jugular foramen (series 3, image 58), which raises the possibility for an occult left temporal bone fracture. There is a trace left mastoid effusion. Orbits: There are small amount of blood products of the orbital floor on the left (series 7, image 68). Orbits are otherwise unremarkable. Sinuses: There is near-complete opacification of the bilateral maxillary, ethmoid sinuses. Soft tissues: 1 there is soft swelling along the bilateral pre maxillary soft tissues, left-greater-than-right, as well as the soft tissues along the nasal bridge. There is subcutaneous emphysema. CT CERVICAL SPINE FINDINGS  Limitations: Motion degraded exam Alignment: Straightening of the normal cervical lordosis. Skull base and vertebrae: No acute fracture. No primary bone lesion or focal pathologic process. Soft tissues and spinal canal: No prevertebral fluid or swelling. No visible canal hematoma. Disc levels:  No CT evidence of high-grade spinal canal stenosis. Upper chest: Negative. Other: None IMPRESSION: 1. Multiple complex facial bone fractures, as above. Of note, there is a Jerry Caras type 2 fracture on the left. 2. There may also be an occulte temporal bone fracture that extends into the left jugular foramen. Recommend further evaluation with a dedicated temporal bone CT. 3. Subtle cortical irregularity along the left jugular foramen, which raises the possibility for an occult left temporal bone fracture. Recommend correlation with point tenderness. 4. No CT evidence of intracranial injury. 5. No acute fracture or traumatic subluxation of the cervical spine. Electronically Signed   By: Lorenza Cambridge M.D.   On: 08/25/2022 11:31   DG Pelvis Portable  Result Date: 08/25/2022 CLINICAL DATA:  Pain after trauma EXAM: PORTABLE PELVIS 1 VIEWS COMPARISON:  None Available. FINDINGS: There is no evidence of pelvic fracture or diastasis. No pelvic bone  lesions are seen. IMPRESSION: No acute osseous abnormality Electronically Signed   By: Karen Kays M.D.   On: 08/25/2022 10:48   DG Chest Port 1 View  Result Date: 08/25/2022 CLINICAL DATA:  Pain after trauma EXAM: PORTABLE CHEST 1 VIEW COMPARISON:  None Available. FINDINGS: The heart size and mediastinal contours are within normal limits. Both lungs are clear. No consolidation, pneumothorax or effusion. No edema. The inferior costophrenic angles are clipped off the edge of the film. The visualized skeletal structures are unremarkable. Overlapping cardiac leads. IMPRESSION: No acute cardiopulmonary disease. Electronically Signed   By: Karen Kays M.D.   On: 08/25/2022 10:47    Pending Labs Unresulted Labs (From admission, onward)     Start     Ordered   08/26/22 0500  CBC  Tomorrow morning,   R        08/25/22 1557   08/26/22 0500  Basic metabolic panel  Tomorrow morning,   R        08/25/22 1557   08/25/22 1556  HIV Antibody (routine testing w rflx)  (HIV Antibody (Routine testing w reflex) panel)  Once,   R        08/25/22 1557            Vitals/Pain Today's Vitals   08/25/22 1515 08/25/22 1530 08/25/22 1545 08/25/22 1548  BP: 128/76 120/78 126/73   Pulse: (!) 104 (!) 105 (!) 108   Resp: 16 12 18    Temp:      TempSrc:      SpO2: 97% 97% 97%   Weight:      Height:      PainSc:    5     Isolation Precautions No active isolations  Medications Medications  acetaminophen (TYLENOL) tablet 1,000 mg (has no administration in time range)  methocarbamol (ROBAXIN) tablet 500 mg (500 mg Oral Given 08/25/22 1641)    Or  methocarbamol (ROBAXIN) 500 mg in dextrose 5 % 50 mL IVPB ( Intravenous See Alternative 08/25/22 1641)  docusate sodium (COLACE) capsule 100 mg (has no administration in time range)  polyethylene glycol (MIRALAX / GLYCOLAX) packet 17 g (has no administration in time range)  ondansetron (ZOFRAN-ODT) disintegrating tablet 4 mg (has no administration in time range)     Or  ondansetron (ZOFRAN) injection 4 mg (has no administration  in time range)  metoprolol tartrate (LOPRESSOR) injection 5 mg (has no administration in time range)  hydrALAZINE (APRESOLINE) injection 10 mg (has no administration in time range)  enoxaparin (LOVENOX) injection 30 mg (has no administration in time range)  oxyCODONE (Oxy IR/ROXICODONE) immediate release tablet 5-10 mg (has no administration in time range)  morphine (PF) 2 MG/ML injection 2 mg (has no administration in time range)  melatonin tablet 3 mg (has no administration in time range)  0.9 %  sodium chloride infusion ( Intravenous New Bag/Given 08/25/22 1645)  morphine (PF) 4 MG/ML injection 4 mg (4 mg Intravenous Given 08/25/22 0959)  lidocaine-EPINEPHrine (XYLOCAINE W/EPI) 2 %-1:200000 (PF) injection 10 mL (10 mLs Infiltration Given 08/25/22 1112)  iohexol (OMNIPAQUE) 350 MG/ML injection 75 mL (75 mLs Intravenous Contrast Given 08/25/22 1050)  oxymetazoline (AFRIN) 0.05 % nasal spray 3 spray (3 sprays Each Nare Given 08/25/22 1306)  oxyCODONE-acetaminophen (PERCOCET/ROXICET) 5-325 MG per tablet 2 tablet (2 tablets Oral Given 08/25/22 1408)  ondansetron (ZOFRAN) injection 4 mg (4 mg Intravenous Given 08/25/22 1506)  sodium chloride 0.9 % bolus 1,000 mL (0 mLs Intravenous Stopped 08/25/22 1544)    Mobility Walks needs back brace applied before walking      Focused Assessments    R Recommendations: See Admitting Provider Note  Report given to:   Additional Notes:
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
## 2022-08-25 NOTE — Progress Notes (Signed)
Orthopedic Tech Progress Note Patient Details:  Aaron Franklin [birth year] [id number]  Ortho Devices Type of Ortho Device: Lumbar corsett Ortho Device/Splint Interventions: Ordered, Adjustment   Post Interventions Patient Tolerated: Well Instructions Provided: Care of device, Adjustment of device Patient received hanger LSO due to initially being told he was being discharged. Darleen Crocker 08/25/2022, 3:22 PM
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
## 2022-08-25 NOTE — ED Notes (Signed)
Per Dr. Criss Alvine--- OK to bypass labs for CT.   Anell Barr, RN  Trauma Response Nurse [phone]

## 2022-08-25 NOTE — Progress Notes (Signed)
   08/25/22 1004  Spiritual Encounters  Type of Visit Initial  Care provided to: Patient  Conversation partners present during encounter Nurse  Referral source Trauma page  Reason for visit Trauma  OnCall Visit No   Chaplain responding to Trauma 2 page.  Pt unavailable as medical team provided care. No support persons present yet, though Pt states his father is coming.  Chaplain services remain available by Spiritual Consult or for emergent cases, paging [phone]  Chaplain Raelene Bott, MDiv [email or web address] [email or web address] [phone]
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
## 2022-08-26 ENCOUNTER — Other Ambulatory Visit: Payer: Self-pay

## 2022-08-26 ENCOUNTER — Inpatient Hospital Stay (HOSPITAL_COMMUNITY): Payer: Self-pay | Admitting: Anesthesiology

## 2022-08-26 ENCOUNTER — Encounter (HOSPITAL_COMMUNITY): Payer: Self-pay

## 2022-08-26 ENCOUNTER — Encounter (HOSPITAL_COMMUNITY): Admission: EM | Disposition: A | Discharge status: Home / Self Care | Payer: Self-pay | Source: Home / Self Care

## 2022-08-26 DIAGNOSIS — S02412A LeFort II fracture, initial encounter for closed fracture: Secondary | ICD-10-CM

## 2022-08-26 DIAGNOSIS — M264 Malocclusion, unspecified: Secondary | ICD-10-CM

## 2022-08-26 HISTORY — PX: ORIF MANDIBULAR FRACTURE: SHX2127

## 2022-08-26 LAB — GLUCOSE, CAPILLARY
Glucose-Capillary: 118 mg/dL — ABNORMAL HIGH (ref 70–99)
Glucose-Capillary: 157 mg/dL — ABNORMAL HIGH (ref 70–99)
Glucose-Capillary: 130 mg/dL — ABNORMAL HIGH (ref 70–99)

## 2022-08-26 SURGERY — OPEN REDUCTION INTERNAL FIXATION (ORIF) MANDIBULAR FRACTURE
Anesthesia: General | Site: Mouth | Laterality: Bilateral

## 2022-08-26 MED ORDER — PROPOFOL 10 MG/ML IV BOLUS
INTRAVENOUS | Status: DC | PRN
Start: 2022-08-26 — End: 2022-08-26
  Administered 2022-08-26: 200 mg via INTRAVENOUS
  Administered 2022-08-26: 50 ug/kg/min via INTRAVENOUS

## 2022-08-26 MED ORDER — OXYMETAZOLINE HCL 0.05 % NA SOLN
NASAL | Status: AC
Start: 2022-08-26 — End: 2022-08-26
  Filled 2022-08-26: qty 30

## 2022-08-26 MED ORDER — 0.9 % SODIUM CHLORIDE (POUR BTL) OPTIME
TOPICAL | Status: DC | PRN
  Administered 2022-08-26: 1000 mL

## 2022-08-26 MED ORDER — MIDAZOLAM HCL 2 MG/2ML IJ SOLN
INTRAMUSCULAR | Status: DC | PRN
  Administered 2022-08-26: 2 mg via INTRAVENOUS

## 2022-08-26 MED ORDER — OXYCODONE HCL 5 MG PO TABS: 5.0000 mg | ORAL_TABLET | Freq: Once | ORAL | Status: DC | PRN

## 2022-08-26 MED ORDER — AMISULPRIDE (ANTIEMETIC) 5 MG/2ML IV SOLN: 10.0000 mg | Freq: Once | INTRAVENOUS | Status: DC | PRN

## 2022-08-26 MED ORDER — BACITRACIN ZINC 500 UNIT/GM EX OINT
TOPICAL_OINTMENT | CUTANEOUS | Status: DC | PRN
  Administered 2022-08-26: 1 via TOPICAL

## 2022-08-26 MED ORDER — CEFAZOLIN SODIUM-DEXTROSE 2-4 GM/100ML-% IV SOLN
INTRAVENOUS | Status: AC
  Filled 2022-08-26: qty 100

## 2022-08-26 MED ORDER — LACTATED RINGERS IV SOLN: INTRAVENOUS | Status: DC | PRN

## 2022-08-26 MED ORDER — FENTANYL CITRATE (PF) 250 MCG/5ML IJ SOLN
INTRAMUSCULAR | Status: DC | PRN
  Administered 2022-08-26 (×4): 50 ug via INTRAVENOUS

## 2022-08-26 MED ORDER — CEFAZOLIN SODIUM-DEXTROSE 2-3 GM-%(50ML) IV SOLR
INTRAVENOUS | Status: DC | PRN
  Administered 2022-08-26: 2 g via INTRAVENOUS

## 2022-08-26 MED ORDER — METHYLPREDNISOLONE 4 MG PO TBPK: 4.0000 mg | ORAL_TABLET | Freq: Four times a day (QID) | ORAL | Status: DC

## 2022-08-26 MED ORDER — FENTANYL CITRATE (PF) 250 MCG/5ML IJ SOLN
INTRAMUSCULAR | Status: AC
  Filled 2022-08-26: qty 5

## 2022-08-26 MED ORDER — METHYLPREDNISOLONE 4 MG PO TBPK: 4.0000 mg | ORAL_TABLET | Freq: Three times a day (TID) | ORAL | Status: DC

## 2022-08-26 MED ORDER — METHYLPREDNISOLONE 4 MG PO TBPK
8.0000 mg | ORAL_TABLET | Freq: Every morning | ORAL | Status: AC
  Administered 2022-08-26: 8 mg via ORAL
  Filled 2022-08-26: qty 21

## 2022-08-26 MED ORDER — BACITRACIN ZINC 500 UNIT/GM EX OINT
TOPICAL_OINTMENT | CUTANEOUS | Status: AC
  Filled 2022-08-26: qty 28.35

## 2022-08-26 MED ORDER — METHYLPREDNISOLONE 4 MG PO TBPK
8.0000 mg | ORAL_TABLET | Freq: Every evening | ORAL | Status: AC
  Administered 2022-08-26: 8 mg via ORAL

## 2022-08-26 MED ORDER — DEXAMETHASONE SODIUM PHOSPHATE 10 MG/ML IJ SOLN
INTRAMUSCULAR | Status: DC | PRN
  Administered 2022-08-26: 10 mg via INTRAVENOUS

## 2022-08-26 MED ORDER — SUGAMMADEX SODIUM 200 MG/2ML IV SOLN
INTRAVENOUS | Status: DC | PRN
  Administered 2022-08-26: 200 mg via INTRAVENOUS

## 2022-08-26 MED ORDER — PHENYLEPHRINE HCL-NACL 20-0.9 MG/250ML-% IV SOLN
INTRAVENOUS | Status: DC | PRN
  Administered 2022-08-26: 10 ug/min via INTRAVENOUS
  Administered 2022-08-26 (×2): 80 ug via INTRAVENOUS

## 2022-08-26 MED ORDER — LIDOCAINE-EPINEPHRINE 1 %-1:100000 IJ SOLN
INTRAMUSCULAR | Status: AC
  Filled 2022-08-26: qty 1

## 2022-08-26 MED ORDER — METHYLPREDNISOLONE 4 MG PO TBPK: 8.0000 mg | ORAL_TABLET | Freq: Every evening | ORAL | Status: DC

## 2022-08-26 MED ORDER — LIDOCAINE-EPINEPHRINE 1 %-1:100000 IJ SOLN
INTRAMUSCULAR | Status: DC | PRN
  Administered 2022-08-26: 7 mL

## 2022-08-26 MED ORDER — OXYCODONE HCL 5 MG/5ML PO SOLN: 5.0000 mg | Freq: Once | ORAL | Status: DC | PRN

## 2022-08-26 MED ORDER — CHLORHEXIDINE GLUCONATE 0.12 % MT SOLN
15.0000 mL | Freq: Once | OROMUCOSAL | Status: AC
  Administered 2022-08-26: 15 mL via OROMUCOSAL

## 2022-08-26 MED ORDER — ORAL CARE MOUTH RINSE: 15.0000 mL | Freq: Once | OROMUCOSAL | Status: AC

## 2022-08-26 MED ORDER — METHYLPREDNISOLONE 4 MG PO TBPK
4.0000 mg | ORAL_TABLET | ORAL | Status: AC
  Administered 2022-08-26: 4 mg via ORAL

## 2022-08-26 MED ORDER — LIDOCAINE 2% (20 MG/ML) 5 ML SYRINGE
INTRAMUSCULAR | Status: DC | PRN
  Administered 2022-08-26: 60 mg via INTRAVENOUS

## 2022-08-26 MED ORDER — DEXMEDETOMIDINE HCL IN NACL 80 MCG/20ML IV SOLN
INTRAVENOUS | Status: DC | PRN
  Administered 2022-08-26 (×2): 10 ug via INTRAVENOUS

## 2022-08-26 MED ORDER — EPINEPHRINE PF 1 MG/ML IJ SOLN
INTRAMUSCULAR | Status: AC
  Filled 2022-08-26: qty 1

## 2022-08-26 MED ORDER — EPINEPHRINE HCL (NASAL) 0.1 % NA SOLN
NASAL | Status: DC | PRN
  Administered 2022-08-26: 4 [drp] via TOPICAL

## 2022-08-26 MED ORDER — ROCURONIUM BROMIDE 10 MG/ML (PF) SYRINGE
PREFILLED_SYRINGE | INTRAVENOUS | Status: DC | PRN
  Administered 2022-08-26: 70 mg via INTRAVENOUS
  Administered 2022-08-26: 20 mg via INTRAVENOUS
  Administered 2022-08-26 (×2): 30 mg via INTRAVENOUS

## 2022-08-26 MED ORDER — CHLORHEXIDINE GLUCONATE 0.12 % MT SOLN
15.0000 mL | Freq: Two times a day (BID) | OROMUCOSAL | Status: DC
  Administered 2022-08-26 – 2022-08-28 (×4): 15 mL via OROMUCOSAL
  Filled 2022-08-26 (×4): qty 15

## 2022-08-26 MED ORDER — CHLORHEXIDINE GLUCONATE 0.12 % MT SOLN
OROMUCOSAL | Status: AC
  Filled 2022-08-26: qty 15

## 2022-08-26 MED ORDER — ESMOLOL HCL 100 MG/10ML IV SOLN
INTRAVENOUS | Status: DC | PRN
  Administered 2022-08-26 (×2): 50 mg via INTRAVENOUS

## 2022-08-26 MED ORDER — FENTANYL CITRATE (PF) 100 MCG/2ML IJ SOLN: 25.0000 ug | INTRAMUSCULAR | Status: DC | PRN

## 2022-08-26 MED ORDER — ONDANSETRON HCL 4 MG/2ML IJ SOLN
INTRAMUSCULAR | Status: DC | PRN
  Administered 2022-08-26: 4 mg via INTRAVENOUS

## 2022-08-26 MED ORDER — MIDAZOLAM HCL 2 MG/2ML IJ SOLN
INTRAMUSCULAR | Status: AC
Start: 2022-08-26 — End: ?
  Filled 2022-08-26: qty 2

## 2022-08-26 MED ORDER — ALBUTEROL SULFATE HFA 108 (90 BASE) MCG/ACT IN AERS
INHALATION_SPRAY | RESPIRATORY_TRACT | Status: DC | PRN
Start: 2022-08-26 — End: 2022-08-26
  Administered 2022-08-26: 2 via RESPIRATORY_TRACT
  Administered 2022-08-26: 4 via RESPIRATORY_TRACT

## 2022-08-26 SURGICAL SUPPLY — 82 items
BAND DENTAL 1/4IN PULL MED (MISCELLANEOUS) IMPLANT
BAND DENTAL 3/16IN PULL HEAVY (MISCELLANEOUS) IMPLANT
BIT DRILL TWIST 1.3X5 (BIT) ×1
BIT DRILL TWIST 1.3X5MM (BIT) IMPLANT
BLADE SURG 15 STRL LF DISP TIS (BLADE) IMPLANT
BLADE SURG 15 STRL SS (BLADE) ×1
BUR CROSS CUT FISSURE 1.6 (BURR) IMPLANT
CANISTER SUCT 3000ML PPV (MISCELLANEOUS) ×2 IMPLANT
CLEANER TIP ELECTROSURG 2X2 (MISCELLANEOUS) IMPLANT
CLIP TI MEDIUM 24 (CLIP) IMPLANT
CLIP TI WIDE RED SMALL 24 (CLIP) IMPLANT
CORD BIPOLAR FORCEPS 12FT (ELECTRODE) IMPLANT
COVER SURGICAL LIGHT HANDLE (MISCELLANEOUS) ×2 IMPLANT
DRAPE HALF SHEET 40X57 (DRAPES) IMPLANT
DRILL BIT TWIST 1.3X5MM (BIT) ×1
ELECT COATED BLADE 2.86 ST (ELECTRODE) ×2 IMPLANT
ELECT NDL BLADE 2-5/6 (NEEDLE) IMPLANT
ELECT NDL TIP 2.8 STRL (NEEDLE) IMPLANT
ELECT NEEDLE BLADE 2-5/6 (NEEDLE) IMPLANT
ELECT NEEDLE TIP 2.8 STRL (NEEDLE) IMPLANT
ELECT REM PT RETURN 9FT ADLT (ELECTROSURGICAL) ×1
ELECTRODE REM PT RTRN 9FT ADLT (ELECTROSURGICAL) ×2 IMPLANT
GLOVE BIO SURGEON STRL SZ7.5 (GLOVE) ×2 IMPLANT
GLOVE BIOGEL PI IND STRL 8 (GLOVE) ×2 IMPLANT
GOWN STRL REUS W/ TWL LRG LVL3 (GOWN DISPOSABLE) ×4 IMPLANT
GOWN STRL REUS W/ TWL XL LVL3 (GOWN DISPOSABLE) ×2 IMPLANT
GOWN STRL REUS W/TWL LRG LVL3 (GOWN DISPOSABLE) ×1
GOWN STRL REUS W/TWL XL LVL3 (GOWN DISPOSABLE) ×1
IV CATH 14GX2 1/4 (CATHETERS) ×2 IMPLANT
KIT BASIN OR (CUSTOM PROCEDURE TRAY) ×2 IMPLANT
KIT TURNOVER KIT B (KITS) ×2 IMPLANT
MARKER SKIN DUAL TIP RULER LAB (MISCELLANEOUS) ×2 IMPLANT
NDL 27GX1/2 REG BEVEL ECLIP (NEEDLE) ×2 IMPLANT
NDL FILTER BLUNT 18X1 1/2 (NEEDLE) IMPLANT
NDL HYPO 25GX1X1/2 BEV (NEEDLE) IMPLANT
NEEDLE 27GX1/2 REG BEVEL ECLIP (NEEDLE) ×2 IMPLANT
NEEDLE FILTER BLUNT 18X1 1/2 (NEEDLE) ×1 IMPLANT
NEEDLE HYPO 25GX1X1/2 BEV (NEEDLE) ×1 IMPLANT
NS IRRIG 1000ML POUR BTL (IV SOLUTION) ×2 IMPLANT
PAD ARMBOARD 7.5X6 YLW CONV (MISCELLANEOUS) ×4 IMPLANT
PATTIES SURGICAL .5 X3 (DISPOSABLE) IMPLANT
PENCIL SMOKE EVACUATOR (MISCELLANEOUS) ×2 IMPLANT
PLATE HYBRID MMF SM (Plate) IMPLANT
PLATE MID FACE 6H 8MM 100D LFT (Plate) IMPLANT
SCISSORS WIRE ANG 4 3/4 DISP (INSTRUMENTS) IMPLANT
SCREW LOCKING SELF DRILL 2.0X6 (Screw) IMPLANT
SCREW MID FACE 1.7X5MM SLF TAP (Screw) IMPLANT
SCREW MIDFACE 1.7X4 SLF DRILL (Screw) IMPLANT
SCREW MIDFACE 1.7X4MM SLF TAP (Screw) IMPLANT
SCREW MIDFACE 1.9X3MM (Screw) IMPLANT
SET WALTER ACTIVATION W/DRAPE (SET/KITS/TRAYS/PACK) IMPLANT
SPLINT NASAL DOYLE BI-VL (GAUZE/BANDAGES/DRESSINGS) IMPLANT
SPONGE INTESTINAL PEANUT (DISPOSABLE) IMPLANT
STAPLER VISISTAT 35W (STAPLE) ×2 IMPLANT
SUT BONE WAX W31G (SUTURE) IMPLANT
SUT CHROMIC 3 0 SH 27 (SUTURE) IMPLANT
SUT ETHILON 3 0 PS 1 (SUTURE) IMPLANT
SUT ETHILON 5 0 PS 2 18 (SUTURE) IMPLANT
SUT MNCRL AB 4-0 PS2 18 (SUTURE) IMPLANT
SUT PLAIN GUT FAST 5-0 (SUTURE) IMPLANT
SUT SILK 2 0 PERMA HAND 18 BK (SUTURE) IMPLANT
SUT SILK 2 0 SH (SUTURE) IMPLANT
SUT SILK 3 0 (SUTURE)
SUT SILK 3 0 REEL (SUTURE) IMPLANT
SUT SILK 3 0 SH 30 (SUTURE) IMPLANT
SUT SILK 3 0 SH CR/8 (SUTURE) IMPLANT
SUT SILK 3-0 18XBRD TIE 12 (SUTURE) IMPLANT
SUT STEEL 0 (SUTURE)
SUT STEEL 0 18XMFL TIE 17 (SUTURE) IMPLANT
SUT STEEL 2 (SUTURE) IMPLANT
SUT VIC AB 3-0 FS2 27 (SUTURE) IMPLANT
SUT VIC AB 3-0 SH 8-18 (SUTURE) IMPLANT
SUT VIC AB 4-0 P-3 18X BRD (SUTURE) IMPLANT
SUT VIC AB 4-0 P3 18 (SUTURE)
SUT VIC AB 4-0 RB1 18 (SUTURE) IMPLANT
SUT VICRYL 3-0 RB1 18 ABS (SUTURE) IMPLANT
SYR 5ML LUER SLIP (SYRINGE) IMPLANT
TOOTHBRUSH ADULT (PERSONAL CARE ITEMS) ×2 IMPLANT
TOWEL GREEN STERILE FF (TOWEL DISPOSABLE) ×2 IMPLANT
TRAY ENT MC OR (CUSTOM PROCEDURE TRAY) ×2 IMPLANT
TUBE SALEM SUMP 18F (TUBING) IMPLANT
WATER STERILE IRR 1000ML POUR (IV SOLUTION) ×2 IMPLANT

## 2022-08-26 NOTE — Anesthesia Procedure Notes (Signed)
Procedure Name: Intubation Date/Time: 08/26/2022 7:56 AM  Performed by: Loleta Jeymi Hepp, CRNAPre-anesthesia Checklist: Patient identified, Emergency Drugs available, Suction available and Patient being monitored Patient Re-evaluated:Patient Re-evaluated prior to induction Oxygen Delivery Method: Circle system utilized Preoxygenation: Pre-oxygenation with 100% oxygen Induction Type: IV induction Ventilation: Mask ventilation without difficulty Laryngoscope Size: Glidescope and 3 Nasal Tubes: Nasal prep performed, Nasal Rae and Right Tube size: 7.0 mm Number of attempts: 1 Placement Confirmation: ETT inserted through vocal cords under direct vision, positive ETCO2 and breath sounds checked- equal and bilateral Tube secured with: Tape Dental Injury: Teeth and Oropharynx as per pre-operative assessment

## 2022-08-26 NOTE — Progress Notes (Signed)
PT Cancellation Note  Patient Details Name: Alphonso Mayoral MRN: [id number] DOB: 1995-12-23   Cancelled Treatment:    Reason Eval/Treat Not Completed: Patient at procedure or test/unavailable.  Pt is in OR.  PT to check back tomorrow.  Thanks,  Corinna Capra, PT, DPT  Acute Rehabilitation Secure chat is best for contact #[phone] office      Dimas Aguas 08/26/2022, 9:31 AM

## 2022-08-26 NOTE — Transfer of Care (Signed)
Immediate Anesthesia Transfer of Care Note  Patient: Aaron Franklin  Procedure(s) Performed: OPEN REDUCTION INTERNAL FIXATION (ORIF) LEFORTE MANDIBULAR FRACTURE WITH MMF (Bilateral: Mouth)  Patient Location: PACU  Anesthesia Type:General  Level of Consciousness: awake  Airway & Oxygen Therapy: Patient Spontanous Breathing  Post-op Assessment: Report given to RN and Post -op Vital signs reviewed and stable  Post vital signs: Reviewed and stable  Last Vitals:  Vitals Value Taken Time  BP 124/86 08/26/22 1100  Temp    Pulse 109 08/26/22 1101  Resp 13 08/26/22 1101  SpO2 94 % 08/26/22 1101  Vitals shown include unfiled device data.  Last Pain:  Vitals:   08/26/22 0548  TempSrc:   PainSc: 2       Patients Stated Pain Goal: 0 (08/26/22 0548)  Complications: No notable events documented.
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
## 2022-08-26 NOTE — Progress Notes (Signed)
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 [place]iel, 8/10. "Okay for discharge from ENT standpoint. Patient should be discharged on a no chew diet for 3 weeks, Peridex mouth rinse for 1 week, Medrol Dosepak, analgesics and recommended use of ice packs liberally for the next 3 to 5 days. Outpatient follow-up in my office in approximately 1 week. " L4 superior compression fx w/ 15% loss of height - EDP spoke with NSGY, Dr. Jake Samples. Okay to mobilize in LSO brace. PT/OT Facial/lip laceration - Repaired by EDP 8/9 Possible concussion - CTH negative. TBI therapies.  FEN -no chew diet. IVF VTE - SCDs, Lovenox ID - None currently.  Foley - None Dispo - Admit to Trauma, inpatient, med-surg.    I reviewed nursing notes, ED provider notes, last 24 h vitals and pain scores, last 48 h intake and output, last 24 h labs and trends, and last 24 h imaging results.   LOS: 1 day    Berna Bue 08/26/2022  Mdm- high

## 2022-08-26 NOTE — Plan of Care (Signed)

## 2022-08-26 NOTE — Anesthesia Postprocedure Evaluation (Signed)
Anesthesia Post Note  Patient: Aaron Franklin  Procedure(s) Performed: OPEN REDUCTION INTERNAL FIXATION (ORIF) LEFORTE MANDIBULAR FRACTURE WITH MMF (Bilateral: Mouth)     Patient location during evaluation: PACU Anesthesia Type: General Level of consciousness: awake and alert Pain management: pain level controlled Vital Signs Assessment: post-procedure vital signs reviewed and stable Respiratory status: spontaneous breathing, nonlabored ventilation and respiratory function stable Cardiovascular status: stable and blood pressure returned to baseline Anesthetic complications: no   No notable events documented.  Last Vitals:  Vitals:   08/26/22 1130 08/26/22 1143  BP: 126/86 125/84  Pulse: 94 (!) 103  Resp: 16 19  Temp: 37.5 C 37.2 C  SpO2: 96% 95%    Last Pain:  Vitals:   08/26/22 1150  TempSrc:   PainSc: 0-No pain                 Beryle Lathe

## 2022-08-26 NOTE — TOC CAGE-AID Note (Signed)
Transition of Care [facility]) - CAGE-AID Screening   Patient Details  Name: Aaron Franklin MRN: [id number] Date of Birth: August 10, 1995   Hewitt Shorts, RN Trauma Response Nurse Phone Number: [phone] 08/26/2022, 10:47 AM       CAGE-AID Screening:    Have You Ever Felt You Ought to Cut Down on Your Drinking or Drug Use?: No Have People Annoyed You By Critizing Your Drinking Or Drug Use?: No Have You Felt Bad Or Guilty About Your Drinking Or Drug Use?: No Have You Ever Had a Drink or Used Drugs First Thing In The Morning to Steady Your Nerves or to Get Rid of a Hangover?: No CAGE-AID Score: 0  Substance Abuse Education Offered: No (spoke with pt while in ED on 8/9 - states he drinks socially, no drug use)

## 2022-08-26 NOTE — Plan of Care (Signed)
  Problem: Nutrition: Goal: Adequate nutrition will be maintained Outcome: Progressing   Problem: Pain Managment: Goal: General experience of comfort will improve Outcome: Progressing   

## 2022-08-26 NOTE — Progress Notes (Signed)
OT Cancellation Note  Patient Details Name: Aaron Franklin MRN: [id number] DOB: 1995/12/31   Cancelled Treatment:    Reason Eval/Treat Not Completed: Patient at procedure or test/ unavailable (ORIF)  Donia Pounds 08/26/2022, 6:51 AM

## 2022-08-26 NOTE — H&P (Signed)
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 kV according to patient size and/or use of iterative reconstruction technique. COMPARISON:  None Available. FINDINGS: CT HEAD FINDINGS Brain: No hemorrhage. No extra-axial fluid collection. No CT evidence of an acute cortical infarct. No hydrocephalus. No mass effect Vascular: No hyperdense vessel or unexpected calcification. Skull: No calvarial fracture.  See below for facial bone findings. Other: None. CT MAXILLOFACIAL FINDINGS Osseous: There are complex bilateral facial fractures. -there are mildly displaced fractures of the medial and lateral pterygoid plates on the left in the lateral pterygoid plate on the right. -there is a comminuted fracture of the orbital floor on the left (series 9, image 61). There is a nondisplaced fracture of the lateral orbital wall on the left (series 3, image 68). There is also a nondisplaced fracture through the frontal process of the left maxillary bone (series 9, image 70). -there is a mildly displaced fracture of the anterior, lateral, and posterior wall of the left maxillary sinus. Fracture along the  anterior maxillary wall extends through the left nasolacrimal duct (series 3, image 58). Fracture of the posterior left maxillary sinus extends into the left sphenopalatine foramen (series 3, image 59). -there is a mildly displaced fracture of  the extends through the posterior aspect of the alveolar ridge on the left (series 9, image 56). -mildly displaced nasal bone fractures bilaterally. There is also a mildly displaced fracture of the bony nasal septum (series 9, image 65). -there is a subtle cortical irregularity along the jugular foramen (series 3, image 58), which raises the possibility for an occult left temporal bone fracture. There is a trace left mastoid effusion. Orbits: There are small amount of blood products of the orbital floor on the left (series 7, image 68). Orbits are otherwise unremarkable. Sinuses: There is near-complete opacification of the bilateral maxillary, ethmoid sinuses. Soft tissues: 1 there is soft swelling along the bilateral pre maxillary soft tissues, left-greater-than-right, as well as the soft tissues along the nasal bridge. There is subcutaneous emphysema. CT CERVICAL SPINE FINDINGS Limitations: Motion degraded exam Alignment: Straightening of the normal cervical lordosis. Skull base and vertebrae: No acute fracture. No primary bone lesion or focal pathologic process. Soft tissues and spinal canal: No prevertebral fluid or swelling. No visible canal hematoma. Disc levels:  No CT evidence of high-grade spinal canal stenosis. Upper chest: Negative. Other: None IMPRESSION: 1. Multiple complex facial bone fractures, as above. Of note, there is a Jerry Caras type 2 fracture on the left. 2. There may also be an occulte temporal bone fracture that extends into the left jugular foramen. Recommend further evaluation with a dedicated temporal bone CT. 3. Subtle cortical irregularity along the left jugular foramen, which raises the possibility for an occult left temporal bone fracture.  Recommend correlation with point tenderness. 4. No CT evidence of intracranial injury. 5. No acute fracture or traumatic subluxation of the cervical spine. Electronically Signed   By: Lorenza Cambridge M.D.   On: 08/25/2022 11:31   DG Pelvis Portable  Result Date: 08/25/2022 CLINICAL DATA:  Pain after trauma EXAM: PORTABLE PELVIS 1 VIEWS COMPARISON:  None Available. FINDINGS: There is no evidence of pelvic fracture or diastasis. No pelvic bone lesions are seen. IMPRESSION: No acute osseous abnormality Electronically Signed   By: Karen Kays M.D.   On: 08/25/2022 10:48   DG Chest Port 1 View  Result Date: 08/25/2022 CLINICAL DATA:  Pain after trauma EXAM: PORTABLE CHEST 1 VIEW COMPARISON:  None Available. FINDINGS: The heart size and mediastinal contours are within normal limits. Both lungs are clear. No consolidation, pneumothorax or effusion. No edema. The inferior costophrenic angles are clipped off the edge of the film. The visualized skeletal structures are unremarkable. Overlapping cardiac leads. IMPRESSION: No acute cardiopulmonary disease. Electronically Signed   By: Karen Kays M.D.   On: 08/25/2022 10:47    ROS: negative other than stated in HPI  Blood pressure 122/84, pulse (!) 104, temperature 98 F (36.7 C), resp. rate 15, height 5' 9.02" (1.753 m), weight 72.6 kg, SpO2 98%.  PHYSICAL EXAM: General: Resting comfortably in NAD  Lungs: Non-labored respiratinos  Studies Reviewed:  CT Max-face 08/25/22 IMPRESSION: 1. Multiple complex facial bone fractures, as above. Of note, there is a Jerry Caras type 2 fracture on the left. 2. There may also be an occulte temporal bone fracture that extends into the left jugular foramen. Recommend further evaluation with a dedicated temporal bone CT. 3. Subtle cortical irregularity along the left jugular foramen, which raises the possibility for an occult left temporal bone fracture. Recommend correlation with point tenderness. 4. No CT evidence of  intracranial injury. 5. No acute fracture or traumatic subluxation of the cervical spine.     Electronically Signed  By: Lorenza Cambridge M.D.   On: 08/25/2022 11:31     Assessment/Plan Bilateral Jerry Caras I level fractures with malocclusion   Proceed with ORIF bilateral le fort I level fractures, maxillomandibular fixation, nasal intubation.   Informed consent obtained. RBA discussed. Risks of surgery discussed including pain, bleeding, infection, scarring, numbness, poor cosmesis, ongoing malocclusion, injury to the teeth, lips, gums, tongue, V2 numbness, hardware extrusion, hardware infection, need for further surgery, risk of anesthesia. Despite these risks the patient requested to proceed with surgery.     Electronically signed by:  Scarlette Ar, MD  Staff Physician Facial Plastic & Reconstructive Surgery Otolaryngology - Head and Neck Surgery [facility]  08/26/2022, 7:43 AM
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
## 2022-08-27 LAB — CBC
HCT: 43.1 % (ref 39.0–52.0)
HCT: 42.6 % (ref 39.0–52.0)
Hemoglobin: 14.4 g/dL (ref 13.0–17.0)
Hemoglobin: 14.6 g/dL (ref 13.0–17.0)
MCH: 29.9 pg (ref 26.0–34.0)
MCH: 31 pg (ref 26.0–34.0)
MCHC: 33.4 g/dL (ref 30.0–36.0)
MCHC: 34.3 g/dL (ref 30.0–36.0)
MCV: 89.6 fL (ref 80.0–100.0)
MCV: 90.4 fL (ref 80.0–100.0)
Platelets: 270 10*3/uL (ref 150–400)
Platelets: 281 10*3/uL (ref 150–400)
RBC: 4.81 MIL/uL (ref 4.22–5.81)
RBC: 4.71 MIL/uL (ref 4.22–5.81)
RDW: 13.2 % (ref 11.5–15.5)
RDW: 13.3 % (ref 11.5–15.5)
WBC: 13.8 10*3/uL — ABNORMAL HIGH (ref 4.0–10.5)
WBC: 12.8 10*3/uL — ABNORMAL HIGH (ref 4.0–10.5)
nRBC: 0 % (ref 0.0–0.2)
nRBC: 0 % (ref 0.0–0.2)

## 2022-08-27 LAB — POCT I-STAT, CHEM 8
BUN: 6 mg/dL (ref 6–20)
Calcium, Ion: 1.13 mmol/L — ABNORMAL LOW (ref 1.15–1.40)
Chloride: 105 mmol/L (ref 98–111)
Creatinine, Ser: 0.8 mg/dL (ref 0.61–1.24)
Glucose, Bld: 158 mg/dL — ABNORMAL HIGH (ref 70–99)
HCT: 43 % (ref 39.0–52.0)
Hemoglobin: 14.6 g/dL (ref 13.0–17.0)
Potassium: 3.6 mmol/L (ref 3.5–5.1)
Sodium: 140 mmol/L (ref 135–145)
TCO2: 23 mmol/L (ref 22–32)

## 2022-08-27 MED ORDER — SALINE SPRAY 0.65 % NA SOLN
2.0000 | Freq: Four times a day (QID) | NASAL | Status: DC
  Administered 2022-08-27 – 2022-08-28 (×2): 2 via NASAL
  Filled 2022-08-27: qty 44

## 2022-08-27 MED ORDER — OXYMETAZOLINE HCL 0.05 % NA SOLN
1.0000 | Freq: Two times a day (BID) | NASAL | Status: DC
  Administered 2022-08-27 (×2): 1 via NASAL
  Filled 2022-08-27: qty 30

## 2022-08-27 MED ORDER — DEXAMETHASONE SODIUM PHOSPHATE 10 MG/ML IJ SOLN
10.0000 mg | Freq: Three times a day (TID) | INTRAMUSCULAR | Status: DC
  Administered 2022-08-27: 10 mg via INTRAVENOUS
  Filled 2022-08-27 (×2): qty 1

## 2022-08-27 MED ORDER — SURGIFLO WITH THROMBIN (HEMOSTATIC MATRIX KIT) OPTIME
1.0000 | Freq: Once | TOPICAL | Status: AC
  Administered 2022-08-27: 1 via TOPICAL
  Filled 2022-08-27: qty 1

## 2022-08-27 MED ORDER — DEXAMETHASONE SODIUM PHOSPHATE 10 MG/ML IJ SOLN
10.0000 mg | Freq: Three times a day (TID) | INTRAMUSCULAR | Status: AC
  Administered 2022-08-27 (×2): 10 mg via INTRAVENOUS
  Filled 2022-08-27: qty 1
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
## 2022-08-27 NOTE — Progress Notes (Signed)
ENT POST-OP NOTE  POD#1 s/p ORIF Bilateral Jerry Caras I/II Fractures, MMF  S: nose packed overnight for severe epistaxis. No further bleeding, post-nasal drainage. No nausea/vomiting. Endorses facial swelling and discomfort.  O: Facial edema approximately stable. No epistaxis. No blood in oral cavity. MMF in place  AP: POD#1 ORIF Bilateral Le [place] I/II fractures, MMF. Doing well with cessation of epistaxis after packing this AM  Recs: - Complete 24 hours decadron and DC home on medrol dose pack - NO chew diet x 6 weeks - peridex mouth wash - F/u in the office in 1 week  Dispo: Trauma wards  Electronically signed by:  Scarlette Ar, MD  Staff Physician Facial Plastic & Reconstructive Surgery Otolaryngology - Head and Neck Surgery [facility] - [place]
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
## 2022-08-27 NOTE — Progress Notes (Signed)
ENT PROGRESS NOTE  POD#1 s/p ORIF bilateral Jerry Caras I Fracture, MMF  Interval events: - Overnight significant epistaxis, ~250-300 mL bleeding in cannister reported by nursing. Initially hypotensive, rapid response team activated, BP normalized. Due to recalcitrant bleeding after afrin nasal spray and ice water gargles, I was notified for further intervention  S: At bedside, patient feeling malaise, nauseous. Parents at bedside  O: No active bleeding. Dried blood clots at nares. Blood clots in oral cavity. MMF rubberbands in place.   Procedure - nasal packing After verbal consent was obtained, the oral cavity and nasal cavity were cleansed of blood and blood clots. Next surgiflo hemostatic matrix was applied topically to the nasal cavity and bilateral merocel splints were applied. No further bleeding ensued.  MMF rubber bands were released and the oropharynx suctioned without any residual clots. Two guiding elastic rubber bands were placed.  A/P: Epistaxis POD#1 s/p ORIF Jerry Caras I fractures, MMF. Hemostasis achieved.  New recommendations - Start scheduled decadron for facial swelling - Liberal use of facial ice packs - Nasal saline sprays QID - Liquid diet  I have personally spent 22 minutes involved in face-to-face and non-face-to-face activities for this patient on the day of the visit.  Professional time spent includes the following activities, in addition to those noted in the documentation: preparing to see the patient (eg, review of tests), obtaining and/or reviewing separately obtained history, performing a medically appropriate examination and/or evaluation, counseling and educating the patient/family/caregiver, ordering medications, tests or procedures, referring and communicating with other healthcare professionals, documenting clinical information in the electronic or other health record, independently interpreting results and communicating results with the patient/family/caregiver,  care coordination.   Electronically signed by:  Scarlette Ar, MD  Staff Physician Facial Plastic & Reconstructive Surgery Otolaryngology - Head and Neck Surgery [facility] - [place]
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
## 2022-08-27 NOTE — Progress Notes (Signed)
Patient was using suction and is experiencing some bleeding in the mouth.  Will contact on-call MD.  Contacted on call Trauma MD. -Continue to monitor and contact ENT MD if worsening.  0105 Patient now is stating that when he goes from laying down to a sitting position the bleeding is coming from his nose. Looks like 10 mL came out and now scant. His face is very swollen. Will contact ENT MD.  [phone] On call ENT MD  -Order for Afrin nasal spray  [phone] Patient is very concerned.  He did get his nasal spray however he seems to be bleeding more.  In suction cannister 200 mL.  Patient and family very anxious. Rapid contacted and will contact ENT MD again.  Rapid, Trauma, and ENT MD have come to bedside.  Patient is more stable and bleeding improved. Facial swelling appears to have increased.  Patient will be moved to 4N13 progressive.  Patient moved to 4N13 at 0455.  Reported to Ashok Cordia, RN on 4N.

## 2022-08-27 NOTE — Plan of Care (Signed)
Patient transferred from 5N to 4NP with rapid response RN and trauma RN. Patient ax04, c/o 7/10 pain. IV Decadron and IV Morphine given. Patient resting comfortably after. Call bell within reach.  Problem: Education: Goal: Knowledge of General Education information will improve Description: Including pain rating scale, medication(s)/side effects and non-pharmacologic comfort measures Outcome: Progressing   Problem: Health Behavior/Discharge Planning: Goal: Ability to manage health-related needs will improve Outcome: Progressing
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
## 2022-08-28 ENCOUNTER — Other Ambulatory Visit (HOSPITAL_COMMUNITY): Payer: Self-pay

## 2022-08-28 MED ORDER — METHOCARBAMOL 500 MG PO TABS
500.0000 mg | ORAL_TABLET | Freq: Three times a day (TID) | ORAL | 0 refills | Status: DC | PRN
  Filled 2022-08-28: qty 30, 10d supply, fill #0

## 2022-08-28 MED ORDER — CHLORHEXIDINE GLUCONATE 0.12 % MT SOLN
15.0000 mL | Freq: Two times a day (BID) | OROMUCOSAL | 0 refills | Status: DC
  Filled 2022-08-28: qty 473, 16d supply, fill #0

## 2022-08-28 MED ORDER — OXYMETAZOLINE HCL 0.05 % NA SOLN
1.0000 | Freq: Two times a day (BID) | NASAL | 0 refills | Status: DC
  Filled 2022-08-28: qty 0.4, 2d supply, fill #0

## 2022-08-28 MED ORDER — METHYLPREDNISOLONE 4 MG PO TBPK
ORAL_TABLET | ORAL | 0 refills | Status: DC
  Filled 2022-08-28: qty 21, 6d supply, fill #0

## 2022-08-28 MED ORDER — DOCUSATE SODIUM 100 MG PO CAPS
100.0000 mg | ORAL_CAPSULE | Freq: Two times a day (BID) | ORAL | 0 refills | Status: DC
  Filled 2022-08-28: qty 100, 50d supply, fill #0

## 2022-08-28 MED ORDER — SALINE SPRAY 0.65 % NA SOLN
2.0000 | Freq: Four times a day (QID) | NASAL | 0 refills | Status: DC
  Filled 2022-08-28: qty 44, 7d supply, fill #0

## 2022-08-28 MED ORDER — OXYCODONE HCL 5 MG PO TABS
5.0000 mg | ORAL_TABLET | Freq: Four times a day (QID) | ORAL | 0 refills | Status: DC | PRN
  Filled 2022-08-28: qty 15, 4d supply, fill #0

## 2022-08-28 MED ORDER — ACETAMINOPHEN 500 MG PO TABS
1000.0000 mg | ORAL_TABLET | Freq: Four times a day (QID) | ORAL | 0 refills | Status: AC
  Filled 2022-08-28: qty 100, 13d supply, fill #0
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
## 2022-08-28 NOTE — Progress Notes (Signed)
In review of discharge instructions with patient and patient's parents, concerns about the hospital bill due to patient not having medical insurance were raised by family.   Messaged Social worker and Case management to see if any resources available for patient at this time.   Was advised that family/patient to reach out to billing department upon receipt of the bill to discuss payment options at that time.  Family and patient informed of same.

## 2022-08-29 ENCOUNTER — Encounter (HOSPITAL_COMMUNITY): Payer: Self-pay | Admitting: Otolaryngology

## 2022-11-07 ENCOUNTER — Encounter (HOSPITAL_BASED_OUTPATIENT_CLINIC_OR_DEPARTMENT_OTHER): Payer: Self-pay | Admitting: Otolaryngology

## 2022-11-07 ENCOUNTER — Other Ambulatory Visit: Payer: Self-pay

## 2022-11-10 ENCOUNTER — Other Ambulatory Visit: Payer: Self-pay | Admitting: Otolaryngology

## 2022-11-15 ENCOUNTER — Ambulatory Visit (HOSPITAL_BASED_OUTPATIENT_CLINIC_OR_DEPARTMENT_OTHER): Payer: Self-pay | Admitting: Anesthesiology

## 2022-11-15 ENCOUNTER — Ambulatory Visit (HOSPITAL_BASED_OUTPATIENT_CLINIC_OR_DEPARTMENT_OTHER)
Admission: RE | Admit: 2022-11-15 | Discharge: 2022-11-15 | Disposition: A | Discharge status: Home / Self Care | Payer: Self-pay | Attending: Otolaryngology | Admitting: Otolaryngology

## 2022-11-15 ENCOUNTER — Encounter (HOSPITAL_BASED_OUTPATIENT_CLINIC_OR_DEPARTMENT_OTHER)
Admission: RE | Disposition: A | Discharge status: Home / Self Care | Payer: Self-pay | Source: Home / Self Care | Attending: Otolaryngology

## 2022-11-15 ENCOUNTER — Other Ambulatory Visit: Payer: Self-pay

## 2022-11-15 ENCOUNTER — Encounter (HOSPITAL_BASED_OUTPATIENT_CLINIC_OR_DEPARTMENT_OTHER): Payer: Self-pay | Admitting: Otolaryngology

## 2022-11-15 DIAGNOSIS — X58XXXD Exposure to other specified factors, subsequent encounter: Secondary | ICD-10-CM | POA: Insufficient documentation

## 2022-11-15 DIAGNOSIS — Z472 Encounter for removal of internal fixation device: Secondary | ICD-10-CM

## 2022-11-15 DIAGNOSIS — S02411D LeFort I fracture, subsequent encounter for fracture with routine healing: Secondary | ICD-10-CM | POA: Insufficient documentation

## 2022-11-15 DIAGNOSIS — J45909 Unspecified asthma, uncomplicated: Secondary | ICD-10-CM | POA: Insufficient documentation

## 2022-11-15 HISTORY — PX: MANDIBULAR HARDWARE REMOVAL: SHX5205

## 2022-11-15 SURGERY — REMOVAL, HARDWARE, MANDIBLE
Anesthesia: General | Site: Mouth | Laterality: Bilateral

## 2022-11-15 MED ORDER — AMISULPRIDE (ANTIEMETIC) 5 MG/2ML IV SOLN: 10.0000 mg | Freq: Once | INTRAVENOUS | Status: DC | PRN

## 2022-11-15 MED ORDER — DEXAMETHASONE SODIUM PHOSPHATE 10 MG/ML IJ SOLN
INTRAMUSCULAR | Status: AC
  Filled 2022-11-15: qty 1

## 2022-11-15 MED ORDER — MIDAZOLAM HCL 2 MG/2ML IJ SOLN
INTRAMUSCULAR | Status: AC
  Filled 2022-11-15: qty 2

## 2022-11-15 MED ORDER — SUCCINYLCHOLINE CHLORIDE 200 MG/10ML IV SOSY
PREFILLED_SYRINGE | INTRAVENOUS | Status: DC | PRN
  Administered 2022-11-15: 100 mg via INTRAVENOUS

## 2022-11-15 MED ORDER — LIDOCAINE HCL (CARDIAC) PF 100 MG/5ML IV SOSY
PREFILLED_SYRINGE | INTRAVENOUS | Status: DC | PRN
  Administered 2022-11-15: 60 mg via INTRAVENOUS

## 2022-11-15 MED ORDER — SODIUM CHLORIDE 0.9 % IV SOLN: INTRAVENOUS | Status: DC | PRN

## 2022-11-15 MED ORDER — ACETAMINOPHEN 500 MG PO TABS: 1000.0000 mg | ORAL_TABLET | Freq: Once | ORAL | Status: DC

## 2022-11-15 MED ORDER — LIDOCAINE 2% (20 MG/ML) 5 ML SYRINGE
INTRAMUSCULAR | Status: AC
  Filled 2022-11-15: qty 5

## 2022-11-15 MED ORDER — FENTANYL CITRATE (PF) 100 MCG/2ML IJ SOLN: 25.0000 ug | INTRAMUSCULAR | Status: DC | PRN

## 2022-11-15 MED ORDER — OXYCODONE HCL 5 MG PO TABS: 5.0000 mg | ORAL_TABLET | Freq: Once | ORAL | Status: DC | PRN

## 2022-11-15 MED ORDER — FENTANYL CITRATE (PF) 100 MCG/2ML IJ SOLN
INTRAMUSCULAR | Status: AC
  Filled 2022-11-15: qty 2

## 2022-11-15 MED ORDER — ACETAMINOPHEN 500 MG PO TABS
ORAL_TABLET | ORAL | Status: AC
  Filled 2022-11-15: qty 2

## 2022-11-15 MED ORDER — PROPOFOL 10 MG/ML IV BOLUS
INTRAVENOUS | Status: AC
  Filled 2022-11-15: qty 20

## 2022-11-15 MED ORDER — DEXAMETHASONE SODIUM PHOSPHATE 4 MG/ML IJ SOLN
INTRAMUSCULAR | Status: DC | PRN
  Administered 2022-11-15: 10 mg via INTRAVENOUS

## 2022-11-15 MED ORDER — SODIUM CHLORIDE 0.9 % IV SOLN: 12.5000 mg | INTRAVENOUS | Status: DC | PRN

## 2022-11-15 MED ORDER — LIDOCAINE-EPINEPHRINE 1 %-1:100000 IJ SOLN
INTRAMUSCULAR | Status: DC | PRN
  Administered 2022-11-15: 3 mL

## 2022-11-15 MED ORDER — PROPOFOL 10 MG/ML IV BOLUS
INTRAVENOUS | Status: DC | PRN
  Administered 2022-11-15: 200 mg via INTRAVENOUS

## 2022-11-15 MED ORDER — ONDANSETRON HCL 4 MG/2ML IJ SOLN
INTRAMUSCULAR | Status: AC
Start: 2022-11-15 — End: ?
  Filled 2022-11-15: qty 2

## 2022-11-15 MED ORDER — CHLORHEXIDINE GLUCONATE 0.12 % MT SOLN: 15.0000 mL | Freq: Four times a day (QID) | OROMUCOSAL | 0 refills | Status: AC

## 2022-11-15 MED ORDER — OXYCODONE HCL 5 MG/5ML PO SOLN: 5.0000 mg | Freq: Once | ORAL | Status: DC | PRN

## 2022-11-15 MED ORDER — LACTATED RINGERS IV SOLN: INTRAVENOUS | Status: DC

## 2022-11-15 MED ORDER — MIDAZOLAM HCL 5 MG/5ML IJ SOLN
INTRAMUSCULAR | Status: DC | PRN
  Administered 2022-11-15: 2 mg via INTRAVENOUS

## 2022-11-15 MED ORDER — SUCCINYLCHOLINE CHLORIDE 200 MG/10ML IV SOSY
PREFILLED_SYRINGE | INTRAVENOUS | Status: AC
  Filled 2022-11-15: qty 10

## 2022-11-15 MED ORDER — ONDANSETRON HCL 4 MG/2ML IJ SOLN
INTRAMUSCULAR | Status: DC | PRN
  Administered 2022-11-15: 4 mg via INTRAVENOUS

## 2022-11-15 MED ORDER — FENTANYL CITRATE (PF) 100 MCG/2ML IJ SOLN
INTRAMUSCULAR | Status: DC | PRN
  Administered 2022-11-15 (×2): 50 ug via INTRAVENOUS

## 2022-11-15 SURGICAL SUPPLY — 36 items
ADH SKN CLS APL DERMABOND .7 (GAUZE/BANDAGES/DRESSINGS)
BLADE SURG 15 STRL LF DISP TIS (BLADE) ×2 IMPLANT
BLADE SURG 15 STRL SS (BLADE) ×1
CANISTER SUCT 1200ML W/VALVE (MISCELLANEOUS) ×2 IMPLANT
COVER MAYO STAND STRL (DRAPES) ×2 IMPLANT
DERMABOND ADVANCED .7 DNX12 (GAUZE/BANDAGES/DRESSINGS) IMPLANT
ELECT COATED BLADE 2.86 ST (ELECTRODE) IMPLANT
ELECT REM PT RETURN 9FT ADLT (ELECTROSURGICAL)
ELECTRODE REM PT RTRN 9FT ADLT (ELECTROSURGICAL) IMPLANT
GAUZE SPONGE 4X4 12PLY STRL LF (GAUZE/BANDAGES/DRESSINGS) ×4 IMPLANT
GLOVE BIO SURGEON STRL SZ7.5 (GLOVE) ×2 IMPLANT
GLOVE BIOGEL PI IND STRL 7.0 (GLOVE) IMPLANT
GLOVE BIOGEL PI IND STRL 7.5 (GLOVE) IMPLANT
GLOVE BIOGEL PI IND STRL 8 (GLOVE) ×2 IMPLANT
GLOVE ECLIPSE 6.5 STRL STRAW (GLOVE) IMPLANT
GLOVE SURG SS PI 7.5 STRL IVOR (GLOVE) IMPLANT
GOWN STRL REUS W/ TWL LRG LVL3 (GOWN DISPOSABLE) ×2 IMPLANT
GOWN STRL REUS W/ TWL XL LVL3 (GOWN DISPOSABLE) ×2 IMPLANT
GOWN STRL REUS W/TWL 2XL LVL3 (GOWN DISPOSABLE) IMPLANT
GOWN STRL REUS W/TWL LRG LVL3 (GOWN DISPOSABLE) ×1
GOWN STRL REUS W/TWL XL LVL3 (GOWN DISPOSABLE) ×1
MARKER SKIN DUAL TIP RULER LAB (MISCELLANEOUS) IMPLANT
NDL HYPO 27GX1-1/4 (NEEDLE) IMPLANT
NEEDLE HYPO 27GX1-1/4 (NEEDLE) ×1
PACK BASIN DAY SURGERY FS (CUSTOM PROCEDURE TRAY) ×2 IMPLANT
PENCIL SMOKE EVACUATOR (MISCELLANEOUS) IMPLANT
SCISSORS WIRE ANG 4 3/4 DISP (INSTRUMENTS) IMPLANT
SHEET MEDIUM DRAPE 40X70 STRL (DRAPES) ×2 IMPLANT
SPIKE FLUID TRANSFER (MISCELLANEOUS) ×2 IMPLANT
SUT CHROMIC 3 0 PS 2 (SUTURE) IMPLANT
SUT CHROMIC 4 0 PS 2 18 (SUTURE) IMPLANT
SYR CONTROL 10ML LL (SYRINGE) IMPLANT
TOWEL GREEN STERILE FF (TOWEL DISPOSABLE) ×2 IMPLANT
TRAY DSU PREP LF (CUSTOM PROCEDURE TRAY) IMPLANT
TUBE CONNECTING 20X1/4 (TUBING) ×2 IMPLANT
YANKAUER SUCT BULB TIP NO VENT (SUCTIONS) ×2 IMPLANT

## 2022-11-15 NOTE — Op Note (Signed)
OPERATIVE NOTE  Mit Issac Date/Time of Admission: 11/15/2022  9:06 AM  CSN: [id number];UXL:[id number] Attending Provider: Scarlette Ar, MD Room/Bed: MCSP/NONE DOB: 06-Sep-1995 Age: 27 y.o.   Pre-Op Diagnosis: Closed horizontal fracture of maxilla; Malocclusion  Post-Op Diagnosis: Closed horizontal fracture of maxilla; Malocclusion  Procedure: Procedure(s): ARCH BAR REMOVAL  Anesthesia: General  Surgeon(s): Mervin Kung, MD  Staff: Circulator: Lenn Cal, RN Scrub Person: Randalyn Rhea, RN; Konrad Felix, RN  Implants: Implant Name Type Inv. Item Serial No. Manufacturer Lot No. LRB No. Used Action  SCREW LOCKING SELF DRILL 2.0X6 - [id number] Screw SCREW LOCKING SELF DRILL 2.0X6  STRYKER LEIBINGER ON TRAY Right 5 Explanted & Discarded    Specimens: * No specimens in log *  Complications: none  EBL: minimal ML  IVF: Per anesthesia ML  Condition: stable  Operative Findings:  Maxillary arch bar and mandibular hybrid arch bar removed uneventfully  Description of Operation: The patient was identified in the preoperative area and consent confirmed the chart.  He was brought to the operating room by the anesthetist and a preoperative huddle was performed confirming the patient's identity and procedure to be performed.  Once all were in agreement we proceeded with surgery.  General anesthesia was induced and the patient was intubated with oral tube.  The tube was secured.  The patient was then prepped and draped in standard clean fashion.  A timeout pause performed and we proceeded with surgery.  The mandibular and maxillary alveolus was anesthetized with 1% lidocaine with epinephrine.  Next a 15 blade and Freer were used to expose the 5 self drilling hybrid IMF screws.  The mandible hardware screwdriver was used to remove all screws and the arch bars.  The maxillary arch bar wires were loosened, cut and the arch bar removed. The wounds were  copiously irrigated and hemostasis was achieved with pressure.  The teeth were cleansed with a toothbrush and Peridex.  An orogastric tube was used to suction out pharyngoesophageal contents.  The patient was then turned back to the anesthetist to extubated and brought to the recovery room in stable condition.    Mervin Kung, MD [facility] ENT  11/15/2022

## 2022-11-15 NOTE — H&P (Signed)
Aaron Franklin is an 27 y.o. male.    Chief Complaint:  Arch bars   HPI: Patient presents today for planned elective procedure.  He/she denies any interval change in history since office visit on 09/22/22.   Past Medical History:  Diagnosis Date   Asthma     Past Surgical History:  Procedure Laterality Date   ORIF MANDIBULAR FRACTURE Bilateral 08/26/2022   Procedure: OPEN REDUCTION INTERNAL FIXATION (ORIF) LEFORTE MANDIBULAR FRACTURE WITH MMF;  Surgeon: Scarlette Ar, MD;  Location: [facility] OR;  Service: ENT;  Laterality: Bilateral;   TONSILLECTOMY      History reviewed. No pertinent family history.  Social History:  reports that he has never smoked. He does not have any smokeless tobacco history on file. He reports current alcohol use. He reports that he does not use drugs.  Allergies: No Known Allergies  Medications Prior to Admission  Medication Sig Dispense Refill   acetaminophen (TYLENOL) 500 MG tablet Take 2 tablets (1,000 mg total) by mouth every 6 (six) hours. 100 tablet 0   albuterol (PROVENTIL HFA;VENTOLIN HFA) 108 (90 BASE) MCG/ACT inhaler Inhale 2 puffs into the lungs every 6 (six) hours as needed for wheezing or shortness of breath.     cetirizine (ZYRTEC) 10 MG tablet Take 10 mg by mouth daily.     Fluticasone Furoate-Vilanterol (BREO ELLIPTA) 50-25 MCG/ACT AEPB Inhale 1 puff into the lungs daily. (Patient not taking: Reported on 11/07/2022)      No results found for this or any previous visit (from the past 48 hour(s)). No results found.  ROS: negative other than stated in HPI  Blood pressure (!) 117/93, pulse 79, temperature 98 F (36.7 C), temperature source Oral, resp. rate 16, height 5\' 9"  (1.753 m), weight 67.2 kg, SpO2 97%.  PHYSICAL EXAM: General: Resting comfortably in NAD  Lungs: Non-labored respiratinos  Studies Reviewed: none   Assessment/Plan History of Lefort I fractures Retained facial hardware  Proceed with removal of arch bars under  general anesthesia. Informed consent obtained.     Electronically signed by:  Scarlette Ar, MD  Staff Physician Facial Plastic & Reconstructive Surgery Otolaryngology - Head and Neck Surgery [facility]  11/15/2022, 9:46 AM

## 2022-11-15 NOTE — Transfer of Care (Signed)
Immediate Anesthesia Transfer of Care Note  Patient: Danel Freeh  Procedure(s) Performed: ARCH BAR REMOVAL (Bilateral: Mouth)  Patient Location: PACU  Anesthesia Type:General  Level of Consciousness: awake, alert , and oriented  Airway & Oxygen Therapy: Patient Spontanous Breathing and Patient connected to face mask oxygen  Post-op Assessment: Report given to RN and Post -op Vital signs reviewed and stable  Post vital signs: Reviewed and stable  Last Vitals:  Vitals Value Taken Time  BP 112/71 11/15/22 1339  Temp    Pulse 86 11/15/22 1341  Resp 20 11/15/22 1341  SpO2 99 % 11/15/22 1341  Vitals shown include unfiled device data.  Last Pain:  Vitals:   11/15/22 0925  TempSrc: Oral  PainSc: 0-No pain         Complications: No notable events documented.

## 2022-11-15 NOTE — Discharge Instructions (Addendum)
ENT DISCHARGE INSTRUCTIONS  OK To resume regular diet OK to brush teeth Peridex for 1 week  Scarlette Ar MD [facility] [place] [phone] [address] [place], [place] [address] Phone: [phone]   Post Anesthesia Home Care Instructions  Activity: Get plenty of rest for the remainder of the day. A responsible individual must stay with you for 24 hours following the procedure.  For the next 24 hours, DO NOT: -Drive a car -Advertising copywriter -Drink alcoholic beverages -Take any medication unless instructed by your physician -Make any legal decisions or sign important papers.  Meals: Start with liquid foods such as gelatin or soup. Progress to regular foods as tolerated. Avoid greasy, spicy, heavy foods. If nausea and/or vomiting occur, drink only clear liquids until the nausea and/or vomiting subsides. Call your physician if vomiting continues.  Special Instructions/Symptoms: Your throat may feel dry or sore from the anesthesia or the breathing tube placed in your throat during surgery. If this causes discomfort, gargle with warm salt water. The discomfort should disappear within 24 hours.

## 2022-11-15 NOTE — Anesthesia Preprocedure Evaluation (Addendum)
Anesthesia Evaluation  Patient identified by MRN, date of birth, ID band Patient awake    Reviewed: Allergy & Precautions, NPO status , Patient's Chart, lab work & pertinent test results  History of Anesthesia Complications Negative for: history of anesthetic complications  Airway Mallampati: III  TM Distance: >3 FB Neck ROM: Full    Dental  (+) Dental Advisory Given   Pulmonary asthma    Pulmonary exam normal        Cardiovascular negative cardio ROS Normal cardiovascular exam     Neuro/Psych negative neurological ROS  negative psych ROS   GI/Hepatic negative GI ROS, Neg liver ROS,,,  Endo/Other  negative endocrine ROS    Renal/GU negative Renal ROS     Musculoskeletal negative musculoskeletal ROS (+)    Abdominal   Peds  Hematology negative hematology ROS (+)   Anesthesia Other Findings   Reproductive/Obstetrics                             Anesthesia Physical Anesthesia Plan  ASA: 2  Anesthesia Plan: General   Post-op Pain Management: Tylenol PO (pre-op)*   Induction: Intravenous  PONV Risk Score and Plan: 2 and Treatment may vary due to age or medical condition, Ondansetron, Dexamethasone and Midazolam  Airway Management Planned: Oral ETT  Additional Equipment: None  Intra-op Plan:   Post-operative Plan: Extubation in OR  Informed Consent: I have reviewed the patients History and Physical, chart, labs and discussed the procedure including the risks, benefits and alternatives for the proposed anesthesia with the patient or authorized representative who has indicated his/her understanding and acceptance.     Dental advisory given  Plan Discussed with: CRNA, Anesthesiologist and Surgeon  Anesthesia Plan Comments:        Anesthesia Quick Evaluation

## 2022-11-15 NOTE — Anesthesia Postprocedure Evaluation (Signed)
Anesthesia Post Note  Patient: Aaron Franklin  Procedure(s) Performed: ARCH BAR REMOVAL (Bilateral: Mouth)     Patient location during evaluation: PACU Anesthesia Type: General Level of consciousness: awake and alert Pain management: pain level controlled Vital Signs Assessment: post-procedure vital signs reviewed and stable Respiratory status: spontaneous breathing, nonlabored ventilation and respiratory function stable Cardiovascular status: stable and blood pressure returned to baseline Anesthetic complications: no   No notable events documented.  Last Vitals:  Vitals:   11/15/22 1400 11/15/22 1411  BP: 106/74 (!) 126/52  Pulse: 85 97  Resp: 19 16  Temp:  37.3 C  SpO2: 93% 96%    Last Pain:  Vitals:   11/15/22 1411  TempSrc:   PainSc: 0-No pain                 Beryle Lathe

## 2022-11-15 NOTE — Anesthesia Procedure Notes (Signed)
Procedure Name: Intubation Date/Time: 11/15/2022 1:15 PM  Performed by: Burna Cash, CRNAPre-anesthesia Checklist: Patient identified, Emergency Drugs available, Suction available and Patient being monitored Patient Re-evaluated:Patient Re-evaluated prior to induction Oxygen Delivery Method: Circle system utilized Preoxygenation: Pre-oxygenation with 100% oxygen Induction Type: IV induction Ventilation: Mask ventilation without difficulty Laryngoscope Size: Mac and 3 Grade View: Grade I Tube type: Oral Tube size: 7.5 mm Number of attempts: 1 Airway Equipment and Method: Stylet and Oral airway Placement Confirmation: ETT inserted through vocal cords under direct vision, positive ETCO2 and breath sounds checked- equal and bilateral Secured at: 21 cm Tube secured with: Tape Dental Injury: Teeth and Oropharynx as per pre-operative assessment

## 2022-11-16 ENCOUNTER — Encounter (HOSPITAL_BASED_OUTPATIENT_CLINIC_OR_DEPARTMENT_OTHER): Payer: Self-pay | Admitting: Otolaryngology

## 2024-01-09 ENCOUNTER — Ambulatory Visit: Payer: Self-pay | Admitting: Family Medicine
